# Patient Record
Sex: Female | Born: 1970 | Race: White | Hispanic: No | Marital: Married | Smoking: Never smoker
Health system: Southern US, Community
[De-identification: ages and names within clinical notes are randomized; demographics above are authoritative.]

## PROBLEM LIST (undated history)

## (undated) DIAGNOSIS — L0390 Cellulitis, unspecified: Secondary | ICD-10-CM

## (undated) DIAGNOSIS — Z87442 Personal history of urinary calculi: Secondary | ICD-10-CM

## (undated) DIAGNOSIS — M1990 Unspecified osteoarthritis, unspecified site: Secondary | ICD-10-CM

## (undated) HISTORY — PX: BUNIONECTOMY: SHX129

## (undated) HISTORY — PX: WISDOM TOOTH EXTRACTION: SHX21

---

## 2005-06-07 DIAGNOSIS — L0390 Cellulitis, unspecified: Secondary | ICD-10-CM

## 2005-06-07 HISTORY — DX: Cellulitis, unspecified: L03.90

## 2016-08-02 ENCOUNTER — Encounter (HOSPITAL_COMMUNITY): Payer: Self-pay | Admitting: Emergency Medicine

## 2016-08-02 ENCOUNTER — Ambulatory Visit (HOSPITAL_COMMUNITY)
Admission: EM | Admit: 2016-08-02 | Discharge: 2016-08-02 | Disposition: A | Discharge status: Home / Self Care | Payer: BC Managed Care – PPO | Attending: Family Medicine | Admitting: Family Medicine

## 2016-08-02 ENCOUNTER — Ambulatory Visit (INDEPENDENT_AMBULATORY_CARE_PROVIDER_SITE_OTHER): Payer: BC Managed Care – PPO

## 2016-08-02 DIAGNOSIS — M25561 Pain in right knee: Secondary | ICD-10-CM | POA: Diagnosis not present

## 2016-08-02 DIAGNOSIS — M25461 Effusion, right knee: Secondary | ICD-10-CM | POA: Insufficient documentation

## 2016-08-02 HISTORY — DX: Cellulitis, unspecified: L03.90

## 2016-08-02 LAB — URIC ACID: Uric Acid, Serum: 3.4 mg/dL (ref 2.3–6.6)

## 2016-08-02 MED ORDER — ACETAMINOPHEN 325 MG PO TABS
ORAL_TABLET | ORAL | Status: AC
  Filled 2016-08-02: qty 2

## 2016-08-02 MED ORDER — ACETAMINOPHEN 325 MG PO TABS
650.0000 mg | ORAL_TABLET | Freq: Once | ORAL | Status: AC
  Administered 2016-08-02: 650 mg via ORAL

## 2016-08-02 NOTE — ED Provider Notes (Addendum)
CSN: [id number]     Arrival date & time 08/02/16  1808 History   First MD Initiated Contact with Patient 08/02/16 1933     Chief Complaint  Patient presents with  . Knee Pain   (Consider location/radiation/quality/duration/timing/severity/associated sxs/prior Treatment) 46 year old female presents the urgent care with knee pain and swelling. She states that last Wednesday, 5 days ago she had been running. 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 The following day she did squats and lunges. On Friday the third day, she started having stiffness in the knee and then on Saturday some swelling and more stiffness and discomfort. Over Sunday and today she did develop some pain with increased swelling of the right knee.      Past Medical History:  Diagnosis Date  . Cellulitis 2007   Past Surgical History:  Procedure Laterality Date  . CESAREAN SECTION     History reviewed. No pertinent family history. Social History  Substance Use Topics  . Smoking status: Never Smoker  . Smokeless tobacco: Never Used  . Alcohol use No   OB History    Gravida Para Term Preterm AB Living   4         4   SAB TAB Ectopic Multiple Live Births                 Review of Systems  Constitutional: Negative for activity change, chills and fever.  HENT: Negative.   Respiratory: Negative.   Cardiovascular: Negative.   Musculoskeletal: Positive for arthralgias.       As per HPI  Skin: Negative for color change, pallor and rash.  Neurological: Negative.     Allergies  Patient has no known allergies.  Home Medications   Prior to Admission medications   Medication Sig Start Date End Date Taking? Authorizing Provider  ibuprofen (ADVIL,MOTRIN) 600 MG tablet Take 600 mg by mouth every 6 (six) hours as needed.   Yes Historical Provider, MD   Meds Ordered and Administered this Visit   Medications  acetaminophen (TYLENOL) tablet 650 mg (650 mg Oral Given 08/02/16  1937)    BP 132/94 (BP Location: Right Arm)   Pulse 80   Temp 101.2 F (38.4 C) (Oral)   LMP 07/31/2016 (Exact Date)   SpO2 100%  No data found.   Physical Exam  Constitutional: She is oriented to person, place, and time. She appears well-developed and well-nourished. No distress.  HENT:  Head: Normocephalic and atraumatic.  Eyes: EOM are normal.  Neck: Normal range of motion. Neck supple.  Pulmonary/Chest: Effort normal.  Musculoskeletal:  Right knee with swelling and apparent effusion. No erythema, increased warmth. Minor tenderness. Extension to 170. Flexion limited to 90. Negative drawer, negative varus, negative valgus, external and internal rotation does not cause pain. Fluid is palpated at the joint spaces and proximal knee.  Neurological: She is alert and oriented to person, place, and time. No cranial nerve deficit.  Skin: Skin is warm and dry. Capillary refill takes less than 2 seconds.  Psychiatric: She has a normal mood and affect.  Nursing note and vitals reviewed.   Urgent Care Course     Procedures (including critical care time)  Labs Review Labs Reviewed  URIC ACID    Imaging Review Dg Knee Complete 4 Views Right  Result Date: 08/02/2016 CLINICAL DATA:  Right knee swelling. EXAM: RIGHT KNEE - COMPLETE 4+ VIEW COMPARISON:  None. FINDINGS: No acute fracture or dislocation.  No lytic or sclerotic osseous lesion. No bone destruction or periosteal reaction. Large right knee joint effusion. Mild medial femorotibial compartment joint space narrowing. IMPRESSION: 1.  No acute osseous injury of the right knee. 2. Large right knee joint effusion. Electronically Signed   By: Elige Ko   On: 08/02/2016 19:40     Visual Acuity Review  Right Eye Distance:   Left Eye Distance:   Bilateral Distance:    Right Eye Near:   Left Eye Near:    Bilateral Near:         MDM   1. Acute pain of right knee   2. Effusion of right knee    Wear the knee immobilizer  at most times. Apply cold compresses for the next to 3 days over the knee. Use crutches with limited weightbearing. Follow-up with the orthopedist listed on page one, it is a group rather than a specific orthopedist. May take ibuprofen as needed. Pt has crutches The patient is noted to have a temperature 101.2. She states she does not feel ill at all. All questions related to symptoms of infection or illness are negative. Consulted with Dr.Xu. To follow-up later this week.    Hayden Rasmussen, NP 08/02/16 1610    Hayden Rasmussen, NP 08/02/16 2125

## 2016-08-02 NOTE — Discharge Instructions (Signed)
Wear the knee immobilizer at most times. Apply cold compresses for the next to 3 days over the knee. Use crutches with limited weightbearing. Follow-up with the orthopedist listed on page one, it is a group rather than a specific orthopedist. May take ibuprofen as needed.

## 2016-08-02 NOTE — ED Triage Notes (Addendum)
Pt ran on Wednesday and did squats and lunges on Thursday.  She states that the knee began to hurt on Friday.  Pt reports swelling in the right knee and increasing pain.

## 2016-08-03 ENCOUNTER — Ambulatory Visit (INDEPENDENT_AMBULATORY_CARE_PROVIDER_SITE_OTHER): Payer: BC Managed Care – PPO | Admitting: Orthopaedic Surgery

## 2016-08-03 ENCOUNTER — Encounter (INDEPENDENT_AMBULATORY_CARE_PROVIDER_SITE_OTHER): Payer: Self-pay | Admitting: Orthopaedic Surgery

## 2016-08-03 VITALS — BP 124/88 | HR 88 | Resp 14 | Ht 65.0 in | Wt 140.0 lb

## 2016-08-03 DIAGNOSIS — M25561 Pain in right knee: Secondary | ICD-10-CM | POA: Diagnosis not present

## 2016-08-03 LAB — SYNOVIAL CELL COUNT + DIFF, W/ CRYSTALS
Basophils, %: 0 %
EOSINOPHILS-SYNOVIAL: 0 % (ref 0–2)
Lymphocytes-Synovial Fld: 27 % (ref 0–74)
Monocyte/Macrophage: 0 % (ref 0–69)
NEUTROPHIL, SYNOVIAL: 73 % — AB (ref 0–24)
SYNOVIOCYTES, %: 0 % (ref 0–15)
WBC, SYNOVIAL: 61360 {cells}/uL — AB (ref <150)

## 2016-08-03 MED ORDER — LIDOCAINE HCL 1 % IJ SOLN
5.0000 mL | INTRAMUSCULAR | Status: AC | PRN
  Administered 2016-08-03: 5 mL

## 2016-08-03 MED ORDER — BUPIVACAINE HCL 0.5 % IJ SOLN
3.0000 mL | INTRAMUSCULAR | Status: AC | PRN
  Administered 2016-08-03: 3 mL via INTRA_ARTICULAR

## 2016-08-03 MED ORDER — METHYLPREDNISOLONE ACETATE 40 MG/ML IJ SUSP
80.0000 mg | INTRAMUSCULAR | Status: AC | PRN
  Administered 2016-08-03: 80 mg

## 2016-08-03 NOTE — Progress Notes (Signed)
Office Visit Note   Patient: Diamond Cox           Date of Birth: 1971-03-10           MRN: [id number] Visit Date: 08/03/2016              Requested by: No referring provider defined for this encounter. PCP: Pcp Not In System   Assessment & Plan: Visit Diagnoses: acute right knee effusion, pain  Plan:aspiration right knee, cortisone injection, office Friday Aspirated over 40 mL of cloudy, green fluid. I suspect she may have pseudogout. I injected  cortisone and we'll plan to see her back on Friday. Follow-Up Instructions: No Follow-up on file.   Orders:  No orders of the defined types were placed in this encounter.  No orders of the defined types were placed in this encounter.     Procedures: Large Joint Inj Date/Time: 08/03/2016 1:03 PM Performed by: Valeria BatmanWHITFIELD, Jaquis Picklesimer W Authorized by: Valeria BatmanWHITFIELD, Ridhi Hoffert W   Consent Given by:  Patient Timeout: prior to procedure the correct patient, procedure, and site was verified   Indications:  Pain and joint swelling Location:  Knee Site:  R knee Prep: patient was prepped and draped in usual sterile fashion   Needle Size:  25 G Needle Length:  1.5 inches Approach:  Anteromedial Ultrasound Guidance: No   Fluoroscopic Guidance: No   Arthrogram: No   Medications:  5 mL lidocaine 1 %; 80 mg methylPREDNISolone acetate 40 MG/ML; 3 mL bupivacaine 0.5 % Aspiration Attempted: No   Aspirate amount (mL):  45 Aspirate:  Cloudy Patient tolerance:  Patient tolerated the procedure well with no immediate complications     Clinical Data: No additional findings.   Subjective: No chief complaint on file.   46 year old female presents with knee pain and swelling. She states that last Wednesday, 5 days ago she had been running. 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 The following day she did squats and lunges. On Friday the third day, she started having stiffness in the knee and then on  Saturday some swelling and more stiffness and discomfort. Over Sunday and today she did develop some pain with increased swelling of the right knee.   [facility]. Workup was negative. Has had an elevated temperature high as 101. Right knee feels tight but not painful. Has been using crutches and a knee immobilizer. A specific injury or trauma or prior problem with this same knee..  Review of Systems   Objective: Vital Signs: LMP 07/31/2016 (Exact Date)   Physical Exam  Ortho ExamRight knee with large effusion. Minimally warm. No localized area of tenderness. No instability. Cultures to a straight leg raise related to the size of the effusion. No calf pain. No distal edema. Intact.  Specialty Comments:  No specialty comments available.  Imaging: Dg Knee Complete 4 Views Right  Result Date: 08/02/2016 CLINICAL DATA:  Right knee swelling. EXAM: RIGHT KNEE - COMPLETE 4+ VIEW COMPARISON:  None. FINDINGS: No acute fracture or dislocation. No lytic or sclerotic osseous lesion. No bone destruction or periosteal reaction. Large right knee joint effusion. Mild medial femorotibial compartment joint space narrowing. IMPRESSION: 1.  No acute osseous injury of the right knee. 2. Large right knee joint effusion. Electronically Signed   By: Elige KoHetal  Patel   On: 08/02/2016 19:40     PMFS History: There are no active problems to display for this patient.  Past Medical History:  Diagnosis Date  . Cellulitis 2007    No family history on file.  Past Surgical History:  Procedure Laterality Date  . CESAREAN SECTION     Social History   Occupational History  . Not on file.   Social History Main Topics  . Smoking status: Never Smoker  . Smokeless tobacco: Never Used  . Alcohol use No  . Drug use: No  . Sexual activity: Not on file

## 2016-08-05 ENCOUNTER — Encounter (HOSPITAL_COMMUNITY): Payer: Self-pay | Admitting: *Deleted

## 2016-08-05 NOTE — H&P (Signed)
Diamond CampbellPeter Whitfield, MD   Diamond CodeBrian Tashaun Obey, PA-C [address], [place], [place]  [id number]                             [phone]   ORTHOPAEDIC HISTORY & PHYSICAL  Diamond IceJamie Cox MRN:  [id number] DOB/SEX:  Apr 15, 1971/female  CHIEF COMPLAINT:  Painful right knee  HISTORY:  46 year old female presents with knee pain and swelling. She states that on  she had been running. 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 She was running on 07/28/2016 without difficulty. The following day she did squats and lunges. On Friday the third day, she started having stiffness in the knee and then on Saturday some swelling and more stiffness and discomfort. Over that Sunday she did develop some pain with increased swelling of the right knee.  [facility]. Workup was negative. Has had an elevated temperature high as 101. Right knee felt tight but not painful. Had been using crutches and a knee immobilizer. No specific injury or trauma or prior problem with this same knee..  An aspiration right knee and a cortisone injection in office 08/03/2016. Aspirated over 40 mL of cloudy, green fluid. Suspect she may have pseudogout and injection of cortisone given.  Cultures were sent.  Laboratory called and a verbal report was given showing staph aureus growing but speciated yet.    Patient was called and plans are now in place for an arthroscopic debridement and lavage of the right knee.  PAST MEDICAL HISTORY: There are no active problems to display for this patient.  Past Medical History:  Diagnosis Date  . Cellulitis 2007   Past Surgical History:  Procedure Laterality Date  . CESAREAN SECTION       MEDICATIONS:   No current facility-administered medications for this encounter.    Current Outpatient Prescriptions  Medication Sig Dispense Refill  . acetaminophen (TYLENOL) 500 MG tablet Take 500 mg by mouth every 6 (six) hours as  needed.    Marland Kitchen. ibuprofen (ADVIL,MOTRIN) 600 MG tablet Take 600 mg by mouth every 6 (six) hours as needed.         ALLERGIES:  No Known Allergies  REVIEW OF SYSTEMS:  A comprehensive review of systems was negative.   FAMILY HISTORY:  No family history on file.  SOCIAL HISTORY:   Social History  Substance Use Topics  . Smoking status: Never Smoker  . Smokeless tobacco: Never Used  . Alcohol use No      EXAMINATION: Vital signs in last 24 hours:    Head is normocephalic.   Eyes:  Pupils equal, round and reactive to light and accommodation.  Extraocular intact. ENT: Ears, nose, and throat were benign.   Neck: supple, no bruits were noted.   Chest: good expansion.   Lungs: essentially clear.   Cardiac: regular rhythm and rate, normal S1, S2.  No murmurs appreciated. Pulses :  2+ bilateral and symmetric in lower extremities. Abdomen is scaphoid, soft, nontender, no masses palpable, normal bowel sounds present. CNS:  She is oriented x3 and cranial nerves II-XII grossly intact. Breast, rectal, and genital exams: not performed and not indicated for an orthopedic evaluation. Musculoskeletal: Right knee with large effusion. Minimally warm. No localized area of tenderness. No instability.   Imaging Review Result Date: 08/02/2016 CLINICAL DATA:  Right knee swelling. EXAM: RIGHT KNEE - COMPLETE 4+ VIEW  COMPARISON:  None. FINDINGS: No acute fracture or dislocation. No lytic or sclerotic osseous lesion. No bone destruction or periosteal reaction. Large right knee joint effusion. Mild medial femorotibial compartment joint space narrowing. IMPRESSION: 1.  No acute osseous injury of the right knee. 2. Large right knee joint effusion. Electronically Signed   By: Elige Ko   On: 08/02/2016 19:40   ASSESSMENT: right septic arthritis of the knee  Past Medical History:  Diagnosis Date  . Cellulitis 2007    PLAN: Plan for right arthroscopic lavage of the knee with debridement  The  procedure,  risks, and benefits of surgery were presented and reviewed. The risks including but not limited to infection, blood clots, vascular and nerve injury, stiffness,  among others were discussed. The patient acknowledged the explanation, agreed to proceed.   Oris Drone Aleda Grana [facility] [phone]  08/05/2016 5:04 PM

## 2016-08-05 NOTE — Progress Notes (Signed)
Spoke with pt for pre-op call. Pt denies cardiac history, chest pain or sob. 

## 2016-08-06 ENCOUNTER — Ambulatory Visit (INDEPENDENT_AMBULATORY_CARE_PROVIDER_SITE_OTHER): Payer: BC Managed Care – PPO | Admitting: Orthopaedic Surgery

## 2016-08-06 ENCOUNTER — Inpatient Hospital Stay (HOSPITAL_COMMUNITY)
Admission: RE | Admit: 2016-08-06 | Discharge: 2016-08-08 | DRG: 487 | Disposition: A | Discharge status: Home Health | Payer: BC Managed Care – PPO | Source: Ambulatory Visit | Attending: Orthopaedic Surgery | Admitting: Orthopaedic Surgery

## 2016-08-06 ENCOUNTER — Inpatient Hospital Stay (HOSPITAL_COMMUNITY): Payer: BC Managed Care – PPO | Admitting: Certified Registered Nurse Anesthetist

## 2016-08-06 ENCOUNTER — Encounter (HOSPITAL_COMMUNITY)
Admission: RE | Disposition: A | Discharge status: Home Health | Payer: Self-pay | Source: Ambulatory Visit | Attending: Orthopaedic Surgery

## 2016-08-06 ENCOUNTER — Encounter (HOSPITAL_COMMUNITY): Payer: Self-pay | Admitting: *Deleted

## 2016-08-06 DIAGNOSIS — M00061 Staphylococcal arthritis, right knee: Secondary | ICD-10-CM | POA: Diagnosis present

## 2016-08-06 DIAGNOSIS — M00861 Arthritis due to other bacteria, right knee: Secondary | ICD-10-CM | POA: Diagnosis not present

## 2016-08-06 DIAGNOSIS — Z820 Family history of epilepsy and other diseases of the nervous system: Secondary | ICD-10-CM

## 2016-08-06 DIAGNOSIS — Z87442 Personal history of urinary calculi: Secondary | ICD-10-CM | POA: Diagnosis not present

## 2016-08-06 DIAGNOSIS — Z9889 Other specified postprocedural states: Secondary | ICD-10-CM | POA: Diagnosis not present

## 2016-08-06 DIAGNOSIS — B9561 Methicillin susceptible Staphylococcus aureus infection as the cause of diseases classified elsewhere: Secondary | ICD-10-CM | POA: Diagnosis present

## 2016-08-06 DIAGNOSIS — Z91018 Allergy to other foods: Secondary | ICD-10-CM | POA: Diagnosis not present

## 2016-08-06 DIAGNOSIS — M009 Pyogenic arthritis, unspecified: Secondary | ICD-10-CM | POA: Diagnosis present

## 2016-08-06 DIAGNOSIS — M25561 Pain in right knee: Secondary | ICD-10-CM | POA: Diagnosis present

## 2016-08-06 DIAGNOSIS — Z9689 Presence of other specified functional implants: Secondary | ICD-10-CM | POA: Diagnosis not present

## 2016-08-06 HISTORY — DX: Personal history of urinary calculi: Z87.442

## 2016-08-06 HISTORY — PX: KNEE ARTHROSCOPY: SHX127

## 2016-08-06 HISTORY — DX: Unspecified osteoarthritis, unspecified site: M19.90

## 2016-08-06 LAB — URINALYSIS, ROUTINE W REFLEX MICROSCOPIC
BACTERIA UA: NONE SEEN
Bilirubin Urine: NEGATIVE
Glucose, UA: NEGATIVE mg/dL
Ketones, ur: 5 mg/dL — AB
LEUKOCYTES UA: NEGATIVE
Nitrite: NEGATIVE
PH: 5 (ref 5.0–8.0)
Protein, ur: NEGATIVE mg/dL
SPECIFIC GRAVITY, URINE: 1.024 (ref 1.005–1.030)
SQUAMOUS EPITHELIAL / LPF: NONE SEEN

## 2016-08-06 LAB — COMPREHENSIVE METABOLIC PANEL
ALK PHOS: 49 U/L (ref 38–126)
ALT: 11 U/L — ABNORMAL LOW (ref 14–54)
ANION GAP: 9 (ref 5–15)
AST: 12 U/L — ABNORMAL LOW (ref 15–41)
Albumin: 3.6 g/dL (ref 3.5–5.0)
BUN: 10 mg/dL (ref 6–20)
CALCIUM: 9.4 mg/dL (ref 8.9–10.3)
CO2: 26 mmol/L (ref 22–32)
Chloride: 105 mmol/L (ref 101–111)
Creatinine, Ser: 0.68 mg/dL (ref 0.44–1.00)
Glucose, Bld: 97 mg/dL (ref 65–99)
Potassium: 3.6 mmol/L (ref 3.5–5.1)
Sodium: 140 mmol/L (ref 135–145)
Total Bilirubin: 0.7 mg/dL (ref 0.3–1.2)
Total Protein: 6.8 g/dL (ref 6.5–8.1)

## 2016-08-06 LAB — CBC WITH DIFFERENTIAL/PLATELET
Basophils Absolute: 0 10*3/uL (ref 0.0–0.1)
Basophils Relative: 0 %
EOS ABS: 0 10*3/uL (ref 0.0–0.7)
EOS PCT: 0 %
HCT: 36.7 % (ref 36.0–46.0)
Hemoglobin: 12.5 g/dL (ref 12.0–15.0)
LYMPHS ABS: 1.2 10*3/uL (ref 0.7–4.0)
Lymphocytes Relative: 14 %
MCH: 28.8 pg (ref 26.0–34.0)
MCHC: 34.1 g/dL (ref 30.0–36.0)
MCV: 84.6 fL (ref 78.0–100.0)
MONOS PCT: 4 %
Monocytes Absolute: 0.4 10*3/uL (ref 0.1–1.0)
Neutro Abs: 7.2 10*3/uL (ref 1.7–7.7)
Neutrophils Relative %: 82 %
PLATELETS: 259 10*3/uL (ref 150–400)
RBC: 4.34 MIL/uL (ref 3.87–5.11)
RDW: 12.9 % (ref 11.5–15.5)
WBC: 8.8 10*3/uL (ref 4.0–10.5)

## 2016-08-06 LAB — SYNOVIAL CELL COUNT + DIFF, W/ CRYSTALS
Crystals, Fluid: NONE SEEN
EOSINOPHILS-SYNOVIAL: 0 % (ref 0–1)
Lymphocytes-Synovial Fld: 9 % (ref 0–20)
MONOCYTE-MACROPHAGE-SYNOVIAL FLUID: 7 % — AB (ref 50–90)
Neutrophil, Synovial: 84 % — ABNORMAL HIGH (ref 0–25)
OTHER CELLS-SYN: 0
WBC, Synovial: 60625 /mm3 — ABNORMAL HIGH (ref 0–200)

## 2016-08-06 LAB — BODY FLUID CULTURE: GRAM STAIN: NONE SEEN

## 2016-08-06 LAB — PROTIME-INR
INR: 1.04
PROTHROMBIN TIME: 13.7 s (ref 11.4–15.2)

## 2016-08-06 LAB — HCG, SERUM, QUALITATIVE: PREG SERUM: NEGATIVE

## 2016-08-06 LAB — GLUCOSE, CAPILLARY: Glucose-Capillary: 94 mg/dL (ref 65–99)

## 2016-08-06 LAB — APTT: APTT: 32 s (ref 24–36)

## 2016-08-06 SURGERY — ARTHROSCOPY, KNEE
Anesthesia: General | Site: Knee | Laterality: Right

## 2016-08-06 MED ORDER — CEFAZOLIN SODIUM-DEXTROSE 2-4 GM/100ML-% IV SOLN
2.0000 g | Freq: Once | INTRAVENOUS | Status: AC
  Administered 2016-08-06: 2 g via INTRAVENOUS

## 2016-08-06 MED ORDER — SODIUM CHLORIDE 0.9 % IR SOLN
Status: DC | PRN
  Administered 2016-08-06: 1000 mL
  Administered 2016-08-06: 3000 mL
  Administered 2016-08-06: 1000 mL
  Administered 2016-08-06: 3000 mL

## 2016-08-06 MED ORDER — LIDOCAINE-EPINEPHRINE (PF) 1 %-1:200000 IJ SOLN
INTRAMUSCULAR | Status: DC | PRN
Start: 2016-08-06 — End: 2016-08-06
  Administered 2016-08-06: 17 mL via INTRADERMAL

## 2016-08-06 MED ORDER — ONDANSETRON HCL 4 MG/2ML IJ SOLN
INTRAMUSCULAR | Status: AC
  Filled 2016-08-06: qty 2

## 2016-08-06 MED ORDER — FENTANYL CITRATE (PF) 100 MCG/2ML IJ SOLN
INTRAMUSCULAR | Status: AC
  Filled 2016-08-06: qty 2

## 2016-08-06 MED ORDER — PROPOFOL 10 MG/ML IV BOLUS
INTRAVENOUS | Status: AC
  Filled 2016-08-06: qty 20

## 2016-08-06 MED ORDER — METOCLOPRAMIDE HCL 5 MG PO TABS: 5.0000 mg | ORAL_TABLET | Freq: Three times a day (TID) | ORAL | Status: DC | PRN

## 2016-08-06 MED ORDER — KETOROLAC TROMETHAMINE 15 MG/ML IJ SOLN
15.0000 mg | Freq: Four times a day (QID) | INTRAMUSCULAR | Status: AC
  Administered 2016-08-06 – 2016-08-07 (×4): 15 mg via INTRAVENOUS
  Filled 2016-08-06 (×4): qty 1

## 2016-08-06 MED ORDER — MIDAZOLAM HCL 2 MG/2ML IJ SOLN
INTRAMUSCULAR | Status: DC
Start: 2016-08-06 — End: 2016-08-06
  Filled 2016-08-06: qty 2

## 2016-08-06 MED ORDER — SUGAMMADEX SODIUM 200 MG/2ML IV SOLN
INTRAVENOUS | Status: AC
Start: 2016-08-06 — End: 2016-08-06
  Filled 2016-08-06: qty 2

## 2016-08-06 MED ORDER — ONDANSETRON HCL 4 MG PO TABS: 4.0000 mg | ORAL_TABLET | Freq: Four times a day (QID) | ORAL | Status: DC | PRN

## 2016-08-06 MED ORDER — LIDOCAINE-EPINEPHRINE (PF) 1 %-1:200000 IJ SOLN
INTRAMUSCULAR | Status: AC
  Filled 2016-08-06: qty 30

## 2016-08-06 MED ORDER — ONDANSETRON HCL 4 MG/2ML IJ SOLN
4.0000 mg | Freq: Four times a day (QID) | INTRAMUSCULAR | Status: DC | PRN
  Administered 2016-08-06 – 2016-08-08 (×2): 4 mg via INTRAVENOUS
  Filled 2016-08-06: qty 2

## 2016-08-06 MED ORDER — HYDROMORPHONE HCL 1 MG/ML IJ SOLN
INTRAMUSCULAR | Status: AC
  Administered 2016-08-06: 0.5 mg via INTRAVENOUS
  Filled 2016-08-06: qty 0.5

## 2016-08-06 MED ORDER — ONDANSETRON HCL 4 MG/2ML IJ SOLN
INTRAMUSCULAR | Status: DC | PRN
  Administered 2016-08-06: 4 mg via INTRAVENOUS

## 2016-08-06 MED ORDER — MAGNESIUM HYDROXIDE 400 MG/5ML PO SUSP: 30.0000 mL | Freq: Every day | ORAL | Status: DC | PRN

## 2016-08-06 MED ORDER — FENTANYL CITRATE (PF) 100 MCG/2ML IJ SOLN
INTRAMUSCULAR | Status: AC
  Filled 2016-08-06: qty 4

## 2016-08-06 MED ORDER — BUPIVACAINE HCL (PF) 0.25 % IJ SOLN
INTRAMUSCULAR | Status: AC
  Filled 2016-08-06: qty 30

## 2016-08-06 MED ORDER — ZOLPIDEM TARTRATE 5 MG PO TABS: 5.0000 mg | ORAL_TABLET | Freq: Every evening | ORAL | Status: DC | PRN

## 2016-08-06 MED ORDER — DEXMEDETOMIDINE HCL IN NACL 200 MCG/50ML IV SOLN
INTRAVENOUS | Status: AC
  Filled 2016-08-06: qty 50

## 2016-08-06 MED ORDER — PHENYLEPHRINE 40 MCG/ML (10ML) SYRINGE FOR IV PUSH (FOR BLOOD PRESSURE SUPPORT)
PREFILLED_SYRINGE | INTRAVENOUS | Status: AC
  Filled 2016-08-06: qty 20

## 2016-08-06 MED ORDER — PROMETHAZINE HCL 25 MG/ML IJ SOLN
INTRAMUSCULAR | Status: AC
  Administered 2016-08-06: 6.25 mg via INTRAVENOUS
  Filled 2016-08-06: qty 1

## 2016-08-06 MED ORDER — ACETAMINOPHEN 10 MG/ML IV SOLN
1000.0000 mg | Freq: Once | INTRAVENOUS | Status: AC
  Administered 2016-08-06: 1000 mg via INTRAVENOUS
  Filled 2016-08-06: qty 100

## 2016-08-06 MED ORDER — FENTANYL CITRATE (PF) 100 MCG/2ML IJ SOLN
INTRAMUSCULAR | Status: DC | PRN
  Administered 2016-08-06 (×4): 50 ug via INTRAVENOUS

## 2016-08-06 MED ORDER — CEFAZOLIN SODIUM-DEXTROSE 2-4 GM/100ML-% IV SOLN
INTRAVENOUS | Status: AC
  Administered 2016-08-06: 2 g via INTRAVENOUS
  Filled 2016-08-06: qty 100

## 2016-08-06 MED ORDER — CHLORHEXIDINE GLUCONATE CLOTH 2 % EX PADS: 6.0000 | MEDICATED_PAD | Freq: Once | CUTANEOUS | Status: DC

## 2016-08-06 MED ORDER — PROPOFOL 10 MG/ML IV BOLUS
INTRAVENOUS | Status: DC | PRN
  Administered 2016-08-06: 130 mg via INTRAVENOUS

## 2016-08-06 MED ORDER — HYDROMORPHONE HCL 1 MG/ML IJ SOLN
INTRAMUSCULAR | Status: AC
  Administered 2016-08-06: 0.5 mg via INTRAVENOUS
  Filled 2016-08-06: qty 1

## 2016-08-06 MED ORDER — CEFAZOLIN IN D5W 1 GM/50ML IV SOLN: 1.0000 g | Freq: Four times a day (QID) | INTRAVENOUS | Status: DC

## 2016-08-06 MED ORDER — ACETAMINOPHEN 500 MG PO TABS
1000.0000 mg | ORAL_TABLET | Freq: Four times a day (QID) | ORAL | Status: DC
  Administered 2016-08-06 – 2016-08-08 (×6): 1000 mg via ORAL
  Filled 2016-08-06 (×6): qty 2

## 2016-08-06 MED ORDER — PROMETHAZINE HCL 25 MG/ML IJ SOLN
6.2500 mg | Freq: Once | INTRAMUSCULAR | Status: AC
  Administered 2016-08-06: 6.25 mg via INTRAVENOUS

## 2016-08-06 MED ORDER — BUPIVACAINE HCL (PF) 0.25 % IJ SOLN
INTRAMUSCULAR | Status: DC | PRN
  Administered 2016-08-06: .09 mL via INTRA_ARTICULAR

## 2016-08-06 MED ORDER — METOCLOPRAMIDE HCL 5 MG/ML IJ SOLN: 5.0000 mg | Freq: Three times a day (TID) | INTRAMUSCULAR | Status: DC | PRN

## 2016-08-06 MED ORDER — SUCCINYLCHOLINE CHLORIDE 200 MG/10ML IV SOSY
PREFILLED_SYRINGE | INTRAVENOUS | Status: AC
  Filled 2016-08-06: qty 20

## 2016-08-06 MED ORDER — LIDOCAINE 2% (20 MG/ML) 5 ML SYRINGE
INTRAMUSCULAR | Status: AC
  Filled 2016-08-06: qty 25

## 2016-08-06 MED ORDER — SODIUM CHLORIDE 0.9 % IV SOLN: INTRAVENOUS | Status: DC

## 2016-08-06 MED ORDER — DEXAMETHASONE SODIUM PHOSPHATE 10 MG/ML IJ SOLN
INTRAMUSCULAR | Status: AC
  Filled 2016-08-06: qty 4

## 2016-08-06 MED ORDER — ONDANSETRON HCL 4 MG/2ML IJ SOLN
INTRAMUSCULAR | Status: AC
  Filled 2016-08-06: qty 4

## 2016-08-06 MED ORDER — EPHEDRINE 5 MG/ML INJ
INTRAVENOUS | Status: AC
  Filled 2016-08-06: qty 10

## 2016-08-06 MED ORDER — OXYCODONE HCL 5 MG PO TABS
5.0000 mg | ORAL_TABLET | ORAL | Status: DC | PRN
  Administered 2016-08-06: 10 mg via ORAL
  Administered 2016-08-06: 5 mg via ORAL
  Administered 2016-08-07 – 2016-08-08 (×5): 10 mg via ORAL
  Filled 2016-08-06 (×3): qty 2
  Filled 2016-08-06: qty 1
  Filled 2016-08-06 (×2): qty 2

## 2016-08-06 MED ORDER — CEFAZOLIN SODIUM-DEXTROSE 2-4 GM/100ML-% IV SOLN
2.0000 g | Freq: Three times a day (TID) | INTRAVENOUS | Status: DC
  Administered 2016-08-06 – 2016-08-08 (×6): 2 g via INTRAVENOUS
  Filled 2016-08-06 (×6): qty 100

## 2016-08-06 MED ORDER — DIPHENHYDRAMINE HCL 12.5 MG/5ML PO ELIX: 12.5000 mg | ORAL_SOLUTION | ORAL | Status: DC | PRN

## 2016-08-06 MED ORDER — MIDAZOLAM HCL 5 MG/5ML IJ SOLN
INTRAMUSCULAR | Status: DC | PRN
  Administered 2016-08-06: 2 mg via INTRAVENOUS

## 2016-08-06 MED ORDER — CHLORHEXIDINE GLUCONATE 4 % EX LIQD: 60.0000 mL | Freq: Once | CUTANEOUS | Status: DC

## 2016-08-06 MED ORDER — MAGNESIUM CITRATE PO SOLN: 1.0000 | Freq: Once | ORAL | Status: DC | PRN

## 2016-08-06 MED ORDER — HYDROMORPHONE HCL 2 MG/ML IJ SOLN
0.5000 mg | INTRAMUSCULAR | Status: DC | PRN
  Administered 2016-08-07: 1 mg via INTRAVENOUS
  Filled 2016-08-06: qty 1

## 2016-08-06 MED ORDER — BISACODYL 10 MG RE SUPP: 10.0000 mg | Freq: Every day | RECTAL | Status: DC | PRN

## 2016-08-06 MED ORDER — ROCURONIUM BROMIDE 50 MG/5ML IV SOSY
PREFILLED_SYRINGE | INTRAVENOUS | Status: AC
  Filled 2016-08-06: qty 25

## 2016-08-06 MED ORDER — OXYCODONE HCL 5 MG PO TABS
ORAL_TABLET | ORAL | Status: AC
  Administered 2016-08-06: 10 mg via ORAL
  Filled 2016-08-06: qty 2

## 2016-08-06 MED ORDER — HYDROMORPHONE HCL 1 MG/ML IJ SOLN
0.2500 mg | INTRAMUSCULAR | Status: DC | PRN
  Administered 2016-08-06 (×3): 0.5 mg via INTRAVENOUS

## 2016-08-06 MED ORDER — LIDOCAINE 2% (20 MG/ML) 5 ML SYRINGE
INTRAMUSCULAR | Status: DC | PRN
  Administered 2016-08-06: 60 mg via INTRAVENOUS

## 2016-08-06 MED ORDER — SODIUM CHLORIDE 0.9 % IR SOLN
Status: DC | PRN
  Administered 2016-08-06: 3000 mL

## 2016-08-06 MED ORDER — MIDAZOLAM HCL 2 MG/2ML IJ SOLN
INTRAMUSCULAR | Status: AC
  Filled 2016-08-06: qty 2

## 2016-08-06 MED ORDER — LACTATED RINGERS IV SOLN
INTRAVENOUS | Status: DC
  Administered 2016-08-06: 11:00:00 via INTRAVENOUS

## 2016-08-06 SURGICAL SUPPLY — 37 items
BANDAGE ACE 6X5 VEL STRL LF (GAUZE/BANDAGES/DRESSINGS) ×3 IMPLANT
BANDAGE ELASTIC 6 VELCRO ST LF (GAUZE/BANDAGES/DRESSINGS) ×3 IMPLANT
BLADE CUDA 5.5 (BLADE) IMPLANT
BLADE GREAT WHITE 4.2 (BLADE) ×2 IMPLANT
BLADE GREAT WHITE 4.2MM (BLADE) ×1
BUR OVAL 6.0 (BURR) IMPLANT
CUFF TOURNIQUET SINGLE 34IN LL (TOURNIQUET CUFF) IMPLANT
CUFF TOURNIQUET SINGLE 44IN (TOURNIQUET CUFF) IMPLANT
DECANTER SPIKE VIAL GLASS SM (MISCELLANEOUS) ×3 IMPLANT
DRAPE ARTHROSCOPY W/POUCH 114 (DRAPES) ×3 IMPLANT
DRSG EMULSION OIL 3X3 NADH (GAUZE/BANDAGES/DRESSINGS) ×3 IMPLANT
DRSG PAD ABDOMINAL 8X10 ST (GAUZE/BANDAGES/DRESSINGS) ×3 IMPLANT
DURAPREP 26ML APPLICATOR (WOUND CARE) ×3 IMPLANT
GAUZE SPONGE 4X4 12PLY STRL (GAUZE/BANDAGES/DRESSINGS) ×3 IMPLANT
GLOVE BIOGEL PI IND STRL 8 (GLOVE) ×1 IMPLANT
GLOVE BIOGEL PI IND STRL 8.5 (GLOVE) ×1 IMPLANT
GLOVE BIOGEL PI INDICATOR 8 (GLOVE) ×2
GLOVE BIOGEL PI INDICATOR 8.5 (GLOVE) ×2
GLOVE ECLIPSE 8.0 STRL XLNG CF (GLOVE) ×3 IMPLANT
GLOVE SURG ORTHO 8.5 STRL (GLOVE) ×3 IMPLANT
GLOVE SURG SS PI 7.5 STRL IVOR (GLOVE) ×3 IMPLANT
GOWN STRL REUS W/ TWL LRG LVL3 (GOWN DISPOSABLE) ×2 IMPLANT
GOWN STRL REUS W/ TWL XL LVL3 (GOWN DISPOSABLE) ×1 IMPLANT
GOWN STRL REUS W/TWL LRG LVL3 (GOWN DISPOSABLE) ×4
GOWN STRL REUS W/TWL XL LVL3 (GOWN DISPOSABLE) ×2
KIT ROOM TURNOVER OR (KITS) ×3 IMPLANT
MANIFOLD NEPTUNE II (INSTRUMENTS) ×3 IMPLANT
PACK ARTHROSCOPY DSU (CUSTOM PROCEDURE TRAY) ×3 IMPLANT
PAD ARMBOARD 7.5X6 YLW CONV (MISCELLANEOUS) ×6 IMPLANT
PROBE BIPOLAR ATHRO 135MM 90D (MISCELLANEOUS) IMPLANT
SET ARTHROSCOPY TUBING (MISCELLANEOUS) ×2
SET ARTHROSCOPY TUBING LN (MISCELLANEOUS) ×1 IMPLANT
SPONGE LAP 4X18 X RAY DECT (DISPOSABLE) ×3 IMPLANT
TOWEL OR 17X24 6PK STRL BLUE (TOWEL DISPOSABLE) ×6 IMPLANT
WAND HAND CNTRL MULTIVAC 90 (MISCELLANEOUS) ×3 IMPLANT
WAND STAR VAC 90 (SURGICAL WAND) ×3 IMPLANT
WATER STERILE IRR 1000ML POUR (IV SOLUTION) ×3 IMPLANT

## 2016-08-06 NOTE — Consult Note (Addendum)
[name]ious [facility]       Reason for Consult: septic arthritis    Referring Physician: Dr. Durward Fortes  Principal Problem:   Septic arthritis of knee, right ([place])   . chlorhexidine  60 mL Topical Once  . chlorhexidine  60 mL Topical Once  . Chlorhexidine Gluconate Cloth  6 each Topical Once   And  . Chlorhexidine Gluconate Cloth  6 each Topical Once  . ketorolac  15 mg Intravenous Q6H    Recommendations: Cefazolin 2 grams IV every 8 hours for 4 weeks through March 29th ESR, CRP by home health in 2-3 weeks Ok to pull picc line at the end of the duration of treatment esr and crp in am Outpatient parenteral antibiotic therapy order set Routine HIV  PT not yet seen, still in PACU but recommendations as above.  Full consult in am by Dr. Baxter Flattery thanks  Assessment: She has septic arthritis of native knee joint with MSSA and will need prolonged IV antibiotics with monitoring during treatment.    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 Durward Fortes 2/27 and aspiration postive for 61,000 WBC, neutrophil predominance and culture positive for MSSA.  Went to the OR today for washout.  Some fever.  Pain and swelling started about 1 week ago.  No medications or medical problems otherwise.       Past Medical History:  Diagnosis Date  . Arthritis   . Cellulitis 2007  . History of kidney stones    only seen on ultrasound    Social History  Substance Use Topics  . Smoking status: Never Smoker  . Smokeless tobacco: Never Used  . Alcohol use No    Family History  Problem Relation Age of Onset  . Alzheimer's disease Mother     Allergies  Allergen Reactions  . Other     Gluten Free Diet      Vitals:   08/06/16 1428  BP: 135/89  Pulse: 90  Resp: 19  Temp: 98.2 F (36.8 C)     Lab Results  Component Value Date   WBC 8.8  08/06/2016   HGB 12.5 08/06/2016   HCT 36.7 08/06/2016   MCV 84.6 08/06/2016   PLT 259 08/06/2016    Lab Results  Component Value Date   CREATININE 0.68 08/06/2016   BUN 10 08/06/2016   NA 140 08/06/2016   K 3.6 08/06/2016   CL 105 08/06/2016   CO2 26 08/06/2016    Lab Results  Component Value Date   ALT 11 (L) 08/06/2016   AST 12 (L) 08/06/2016   ALKPHOS 49 08/06/2016     Microbiology: Recent Results (from the past 240 hour(s))  Body Fluid Culture     Status: None   Collection Time: 08/03/16 12:10 PM  Result Value Ref Range Status   Culture Few STAPHYLOCOCCUS AUREUS  Final    Comment: SOURCE: BODY FLUID&BODY FLUID   Gram Stain Abundant  Final   Gram Stain WBC present-both PMN and Mononuclear  Final   Gram Stain No Organisms Seen  Final   Organism ID, Bacteria STAPHYLOCOCCUS AUREUS  Final    Comment: Rifampin and Gentamicin should not be used as single drugs for treatment of Staph infections. Critical Results Called to,Read Back By and Verified With: Biagio Borg PA @ 2:02 PM 08/05/16 BY DWEEKS  Susceptibility   Staphylococcus aureus -  (no method available)    OXACILLIN 0.5 Sensitive     CEFAZOLIN  Sensitive     GENTAMICIN <=0.5 Sensitive     CIPROFLOXACIN <=0.5 Sensitive     LEVOFLOXACIN <=0.12 Sensitive     MOXIFLOXACIN <=0.25 Sensitive     TRIMETH/SULFA <=10 Sensitive     VANCOMYCIN <=0.5 Sensitive     CLINDAMYCIN <=0.25 Sensitive     ERYTHROMYCIN 0.5 Sensitive     LINEZOLID 2 Sensitive     QUINUPRISTIN/DALF <=0.25 Sensitive     RIFAMPIN <=0.5 Sensitive     TETRACYCLINE <=1 Sensitive     COMER, Herbie [place], MD [facility] [email or web address] [phone] pager  [phone] cell 08/06/2016, 3:41 PM

## 2016-08-06 NOTE — Anesthesia Procedure Notes (Signed)
Procedure Name: LMA Insertion Date/Time: 08/06/2016 1:27 PM Performed by: Faustino CongressWHITE, Antigone Crowell TENA Jayleana Colberg Pre-anesthesia Checklist: Patient identified, Emergency Drugs available, Suction available and Patient being monitored Patient Re-evaluated:Patient Re-evaluated prior to inductionOxygen Delivery Method: Circle System Utilized Preoxygenation: Pre-oxygenation with 100% oxygen Intubation Type: IV induction LMA: LMA inserted LMA Size: 4.0 Number of attempts: 1 Placement Confirmation: positive ETCO2 Tube secured with: Tape Dental Injury: Teeth and Oropharynx as per pre-operative assessment

## 2016-08-06 NOTE — Progress Notes (Signed)
Pharmacy Antibiotic Note  Diamond Cox is a 46 y.o. female admitted on 08/06/2016 with septic arthritis of right knee.  Pharmacy has been consulted for ancef dosing. Wt 63.5 kg, WBC WNL, creat WNL, AF. Septic arthritis of R knee s/p OR 3/2 with arthroscopic lavage of R knee.    Plan: Ancef 2 gm IV q8- give first dose in PACU - I called and spoke to PACU RN  MD must determine LOT for OPAT consult to be entered  Height: 5\' 5"  (165.1 cm) Weight: 140 lb (63.5 kg) IBW/kg (Calculated) : 57  Temp (24hrs), Avg:98.2 F (36.8 C), Min:98.2 F (36.8 C), Max:98.2 F (36.8 C)   Recent Labs Lab 08/06/16 1104  WBC 8.8  CREATININE 0.68    Estimated Creatinine Clearance: 80.8 mL/min (by C-G formula based on SCr of 0.68 mg/dL).    Allergies  Allergen Reactions  . Other     Gluten Free Diet      Thank you for allowing pharmacy to be a part of this patient's care.  Herby AbrahamMichelle T. Griffyn Kucinski, Pharm.D. [phone] 08/06/2016 4:17 PM

## 2016-08-06 NOTE — Anesthesia Preprocedure Evaluation (Addendum)
Anesthesia Evaluation  Patient identified by MRN, date of birth, ID band Patient awake    Reviewed: Allergy & Precautions, H&P , NPO status , Patient's Chart, lab work & pertinent test results  Airway Mallampati: II  TM Distance: >3 FB Neck ROM: Full    Dental no notable dental hx. (+) Teeth Intact, Dental Advisory Given   Pulmonary neg pulmonary ROS,    Pulmonary exam normal breath sounds clear to auscultation       Cardiovascular negative cardio ROS   Rhythm:Regular Rate:Normal     Neuro/Psych negative neurological ROS  negative psych ROS   GI/Hepatic negative GI ROS, Neg liver ROS,   Endo/Other  negative endocrine ROS  Renal/GU negative Renal ROS  negative genitourinary   Musculoskeletal  (+) Arthritis ,   Abdominal   Peds  Hematology negative hematology ROS (+)   Anesthesia Other Findings   Reproductive/Obstetrics negative OB ROS                            Anesthesia Physical Anesthesia Plan  ASA: I  Anesthesia Plan: General   Post-op Pain Management:    Induction: Intravenous  Airway Management Planned: LMA  Additional Equipment:   Intra-op Plan:   Post-operative Plan: Extubation in OR  Informed Consent: I have reviewed the patients History and Physical, chart, labs and discussed the procedure including the risks, benefits and alternatives for the proposed anesthesia with the patient or authorized representative who has indicated his/her understanding and acceptance.   Dental advisory given  Plan Discussed with: CRNA  Anesthesia Plan Comments:        Anesthesia Quick Evaluation

## 2016-08-06 NOTE — Progress Notes (Signed)
PATIENT ID:      Terance IceJamie Bhullar  MRN:     [id number] DOB/AGE:    44-Aug-1972 / 44 y.o.       OPERATIVE REPORT    DATE OF PROCEDURE:  08/06/2016       PREOPERATIVE DIAGNOSIS:   SEPTIC ARTHRITIS OF RIGHT KNEE                                                       Estimated body mass index is 23.3 kg/m as calculated from the following:   Height as of this encounter: 5\' 5"  (1.651 m).   Weight as of this encounter: 140 lb (63.5 kg).     POSTOPERATIVE DIAGNOSIS:   SEPTIC ARTHRITIS OF RIGHT KNEE                                                                     Estimated body mass index is 23.3 kg/m as calculated from the following:   Height as of this encounter: 5\' 5"  (1.651 m).   Weight as of this encounter: 140 lb (63.5 kg).     PROCEDURE:  Procedure(s): ARTHROSCOPIC LAVAGE OF RIGHT KNEE      SURGEON:  Norlene CampbellPeter Chenoah Mcnally, MD    ASSISTANT:   Jacqualine CodeBrian Petrarca, PA-C   (Present and scrubbed throughout the case, critical for assistance with exposure, retraction, instrumentation, and closure.)          ANESTHESIA: general     DRAINS: hemovac drain,unclamped right knee:      TOURNIQUET TIME: noner    COMPLICATIONS:  None   CONDITION:  stable  PROCEDURE IN DETAIL: [id number]   Valeria Batmaneter W Yarden Hillis 08/06/2016, 2:23 PM  Patient ID: Terance IceJamie Flegal, female   DOB: 21-Jan-1971, 46 y.o.   MRN: [id number]

## 2016-08-06 NOTE — Transfer of Care (Signed)
Immediate Anesthesia Transfer of Care Note  Patient: Diamond IceJamie Kubly  Procedure(s) Performed: Procedure(s): ARTHROSCOPIC LAVAGE OF RIGHT KNEE (Right)  Patient Location: PACU  Anesthesia Type:General  Level of Consciousness: awake, alert , oriented and patient cooperative  Airway & Oxygen Therapy: Patient Spontanous Breathing  Post-op Assessment: Report given to RN and Post -op Vital signs reviewed and stable  Post vital signs: Reviewed and stable  Last Vitals:  Vitals:   08/06/16 1428  Temp: 36.8 C    Last Pain:  Vitals:   08/06/16 1428  PainSc: 0-No pain      Patients Stated Pain Goal: 2 (08/06/16 1050)  Complications: No apparent anesthesia complications

## 2016-08-06 NOTE — Progress Notes (Signed)
The recent History & Physical has been reviewed. I have personally examined the patient today. There is no interval change to the documented History & Physical. The patient would like to proceed with the procedure.  Valeria Batmaneter W Kashon Kraynak 08/06/2016,  1:15 PM  Patient ID: Diamond Cox, female   DOB: May 17, 1971, 46 y.o.   MRN: [id number]

## 2016-08-07 DIAGNOSIS — Z91018 Allergy to other foods: Secondary | ICD-10-CM

## 2016-08-07 DIAGNOSIS — Z9689 Presence of other specified functional implants: Secondary | ICD-10-CM

## 2016-08-07 DIAGNOSIS — M00061 Staphylococcal arthritis, right knee: Principal | ICD-10-CM

## 2016-08-07 DIAGNOSIS — Z9889 Other specified postprocedural states: Secondary | ICD-10-CM

## 2016-08-07 DIAGNOSIS — B9561 Methicillin susceptible Staphylococcus aureus infection as the cause of diseases classified elsewhere: Secondary | ICD-10-CM

## 2016-08-07 DIAGNOSIS — Z820 Family history of epilepsy and other diseases of the nervous system: Secondary | ICD-10-CM

## 2016-08-07 LAB — URINE CULTURE

## 2016-08-07 LAB — C-REACTIVE PROTEIN: CRP: 10.5 mg/dL — AB (ref <1.0)

## 2016-08-07 LAB — HIV ANTIBODY (ROUTINE TESTING W REFLEX): HIV SCREEN 4TH GENERATION: NONREACTIVE

## 2016-08-07 LAB — SEDIMENTATION RATE: SED RATE: 49 mm/h — AB (ref 0–22)

## 2016-08-07 MED ORDER — CEFAZOLIN SODIUM-DEXTROSE 2-4 GM/100ML-% IV SOLN: 2.0000 g | Freq: Three times a day (TID) | INTRAVENOUS | 0 refills | Status: AC

## 2016-08-07 MED ORDER — SODIUM CHLORIDE 0.9% FLUSH: 10.0000 mL | Freq: Two times a day (BID) | INTRAVENOUS | 0 refills | Status: AC

## 2016-08-07 MED ORDER — SODIUM CHLORIDE 0.9% FLUSH: 10.0000 mL | INTRAVENOUS | Status: DC | PRN

## 2016-08-07 MED ORDER — SODIUM CHLORIDE 0.9% FLUSH: 10.0000 mL | INTRAVENOUS | 0 refills | Status: AC | PRN

## 2016-08-07 MED ORDER — CEFAZOLIN IV (FOR PTA / DISCHARGE USE ONLY): 2.0000 g | Freq: Three times a day (TID) | INTRAVENOUS | 0 refills | Status: AC

## 2016-08-07 MED ORDER — SODIUM CHLORIDE 0.9% FLUSH
10.0000 mL | INTRAVENOUS | Status: DC | PRN
  Administered 2016-08-08: 10 mL
  Filled 2016-08-07: qty 40

## 2016-08-07 MED ORDER — SODIUM CHLORIDE 0.9% FLUSH: 10.0000 mL | Freq: Three times a day (TID) | INTRAVENOUS | 0 refills | Status: AC

## 2016-08-07 MED ORDER — HEPARIN SOD (PORK) LOCK FLUSH 100 UNIT/ML IV SOLN: 250.0000 [IU] | INTRAVENOUS | Status: DC | PRN

## 2016-08-07 MED ORDER — HEPARIN SOD (PORK) LOCK FLUSH 100 UNIT/ML IV SOLN: 250.0000 [IU] | Freq: Three times a day (TID) | INTRAVENOUS | Status: AC

## 2016-08-07 MED ORDER — SODIUM CHLORIDE 0.9% FLUSH: 10.0000 mL | Freq: Two times a day (BID) | INTRAVENOUS | Status: DC

## 2016-08-07 NOTE — Consult Note (Signed)
[place] [facility]  Total days of antibiotics 2        Day 2 cefazolin               Reason for Consult:  MSSA septic arthritis   Referring Physician:  whitfield  Principal Problem:   Septic arthritis of knee, right ([place])    HPI: Diamond Cox is a 46 y.o. 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 She has past hx of celiac and hashimotos, but otherwise in great health. Roughly 8 days ago she started to notice new onset right knee pain thought to be due to exercise. She started to take ibuprofen without relief. She went to urgent care 3 days later due to still ongoing knee pain, effusion plus fever of 101F. Xray at that visit shows large effusion per my read. She was referred to Dr Rudene Anda office the following day, where she had arthrocentesis plus steroid injection roughly  5 days prior to admission. Aspirate in the office showed 61K with neutrophil predominance and cultures growing MSSA. She was brought into hospital on 3/2 for washout. She has tolerated her washout without difficulty. Already ambulated twice this morning. Denies ongoing fevers. Has drain in place. Sed rate of 43.  The patient is hoping to still be able to do a race on April 7th.   Past Medical History:  Diagnosis Date  . Arthritis   . Cellulitis 2007  . History of kidney stones    only seen on ultrasound    Allergies:  Allergies  Allergen Reactions  . Other     Gluten Free Diet     MEDICATIONS: . acetaminophen  1,000 mg Oral Q6H  .  ceFAZolin (ANCEF) IV  2 g Intravenous Q8H    Social History  Substance Use Topics  . Smoking status: Never Smoker  . Smokeless tobacco: Never Used  . Alcohol use No    Family History  Problem Relation Age of Onset  . Alzheimer's disease Mother     Review of Systems  Constitutional: Negative for fever, chills, diaphoresis, activity change, appetite change, fatigue and unexpected weight change.  HENT: Negative for  congestion, sore throat, rhinorrhea, sneezing, trouble swallowing and sinus pressure.  Eyes: Negative for photophobia and visual disturbance.  Respiratory: Negative for cough, chest tightness, shortness of breath, wheezing and stridor.  Cardiovascular: Negative for chest pain, palpitations and leg swelling.  Gastrointestinal: Negative for nausea, vomiting, abdominal pain, diarrhea, constipation, blood in stool, abdominal distention and anal bleeding.  Genitourinary: Negative for dysuria, hematuria, flank pain and difficulty urinating.  Musculoskeletal: Negative for myalgias, back pain, joint swelling, arthralgias and gait problem.  Skin: Negative for color change, pallor, rash and wound.  Neurological: Negative for dizziness, tremors, weakness and light-headedness.  Hematological: Negative for adenopathy. Does not bruise/bleed easily.  Psychiatric/Behavioral: Negative for behavioral problems, confusion, sleep disturbance, dysphoric mood, decreased concentration and agitation.     OBJECTIVE: Temp:  [97.9 F (36.6 C)-99.1 F (37.3 C)] 98.7 F (37.1 C) (03/03 0351) Pulse Rate:  [65-90] 72 (03/03 0351) Resp:  [11-20] 16 (03/03 0351) BP: (110-135)/(73-93) 118/73 (03/03 0351) SpO2:  [94 %-100 %] 98 % (03/03 0351) Weight:  [140 lb (63.5 kg)] 140 lb (63.5 kg) (03/02 1821) Physical Exam  Constitutional:  oriented to person, place, and time. appears well-developed and well-nourished. No distress.  HENT: [place]/AT, PERRLA, no scleral icterus Mouth/Throat: Oropharynx is clear and moist. No oropharyngeal exudate.  Cardiovascular: Normal rate, regular rhythm and  normal heart sounds. Exam reveals no gallop and no friction rub.  No murmur heard.  Pulmonary/Chest: Effort normal and breath sounds normal. No respiratory distress.  has no wheezes.  Neck = supple, no nuchal rigidity Abdominal: Soft. Bowel sounds are normal.  exhibits no distension. There is no tenderness.  Lymphadenopathy: no cervical  adenopathy. No axillary adenopathy Neurological: alert and oriented to person, place, and time.  Skin: Skin is warm and dry. No rash noted. No erythema. Ext: right knee wrapped from surgery with accordian drain in place  Psychiatric: a normal mood and affect.  behavior is normal.    LABS: Results for orders placed or performed during the hospital encounter of 08/06/16 (from the past 48 hour(s))  Urinalysis, Routine w reflex microscopic     Status: Abnormal   Collection Time: 08/06/16 11:03 AM  Result Value Ref Range   Color, Urine YELLOW YELLOW   APPearance CLEAR CLEAR   Specific Gravity, Urine 1.024 1.005 - 1.030   pH 5.0 5.0 - 8.0   Glucose, UA NEGATIVE NEGATIVE mg/dL   Hgb urine dipstick LARGE (A) NEGATIVE   Bilirubin Urine NEGATIVE NEGATIVE   Ketones, ur 5 (A) NEGATIVE mg/dL   Protein, ur NEGATIVE NEGATIVE mg/dL   Nitrite NEGATIVE NEGATIVE   Leukocytes, UA NEGATIVE NEGATIVE   RBC / HPF 6-30 0 - 5 RBC/hpf   WBC, UA 0-5 0 - 5 WBC/hpf   Bacteria, UA NONE SEEN NONE SEEN   Squamous Epithelial / LPF NONE SEEN NONE SEEN   Mucous PRESENT   Urine culture     Status: Abnormal   Collection Time: 08/06/16 11:03 AM  Result Value Ref Range   Specimen Description URINE, CLEAN CATCH    Special Requests NONE    Culture <10,000 COLONIES/mL INSIGNIFICANT GROWTH (A)    Report Status 08/07/2016 FINAL   CBC WITH DIFFERENTIAL     Status: None   Collection Time: 08/06/16 11:04 AM  Result Value Ref Range   WBC 8.8 4.0 - 10.5 K/uL   RBC 4.34 3.87 - 5.11 MIL/uL   Hemoglobin 12.5 12.0 - 15.0 g/dL   HCT 36.7 36.0 - 46.0 %   MCV 84.6 78.0 - 100.0 fL   MCH 28.8 26.0 - 34.0 pg   MCHC 34.1 30.0 - 36.0 g/dL   RDW 12.9 11.5 - 15.5 %   Platelets 259 150 - 400 K/uL   Neutrophils Relative % 82 %   Neutro Abs 7.2 1.7 - 7.7 K/uL   Lymphocytes Relative 14 %   Lymphs Abs 1.2 0.7 - 4.0 K/uL   Monocytes Relative 4 %   Monocytes Absolute 0.4 0.1 - 1.0 K/uL   Eosinophils Relative 0 %   Eosinophils  Absolute 0.0 0.0 - 0.7 K/uL   Basophils Relative 0 %   Basophils Absolute 0.0 0.0 - 0.1 K/uL  Comprehensive metabolic panel     Status: Abnormal   Collection Time: 08/06/16 11:04 AM  Result Value Ref Range   Sodium 140 135 - 145 mmol/L   Potassium 3.6 3.5 - 5.1 mmol/L   Chloride 105 101 - 111 mmol/L   CO2 26 22 - 32 mmol/L   Glucose, Bld 97 65 - 99 mg/dL   BUN 10 6 - 20 mg/dL   Creatinine, Ser 0.68 0.44 - 1.00 mg/dL   Calcium 9.4 8.9 - 10.3 mg/dL   Total Protein 6.8 6.5 - 8.1 g/dL   Albumin 3.6 3.5 - 5.0 g/dL   AST 12 (L) 15 - 41 U/L  ALT 11 (L) 14 - 54 U/L   Alkaline Phosphatase 49 38 - 126 U/L   Total Bilirubin 0.7 0.3 - 1.2 mg/dL   GFR calc non Af Amer >60 >60 mL/min   GFR calc Af Amer >60 >60 mL/min    Comment: (NOTE) The eGFR has been calculated using the CKD EPI equation. This calculation has not been validated in all clinical situations. eGFR's persistently <60 mL/min signify possible Chronic Kidney Disease.    Anion gap 9 5 - 15  Protime-INR     Status: None   Collection Time: 08/06/16 11:04 AM  Result Value Ref Range   Prothrombin Time 13.7 11.4 - 15.2 seconds   INR 1.04   APTT     Status: None   Collection Time: 08/06/16 11:04 AM  Result Value Ref Range   aPTT 32 24 - 36 seconds  hCG, serum, qualitative     Status: None   Collection Time: 08/06/16 11:12 AM  Result Value Ref Range   Preg, Serum NEGATIVE NEGATIVE    Comment:        THE SENSITIVITY OF THIS METHODOLOGY IS >10 mIU/mL.   Glucose, capillary     Status: None   Collection Time: 08/06/16 12:38 PM  Result Value Ref Range   Glucose-Capillary 94 65 - 99 mg/dL  Synovial cell count + diff, w/ crystals     Status: Abnormal   Collection Time: 08/06/16  1:43 PM  Result Value Ref Range   Color, Synovial YELLOW (A) YELLOW   Appearance-Synovial TURBID (A) CLEAR   Crystals, Fluid NO CRYSTALS SEEN    WBC, Synovial 60,625 (H) 0 - 200 /cu mm   Neutrophil, Synovial 84 (H) 0 - 25 %   Lymphocytes-Synovial Fld  9 0 - 20 %   Monocyte-Macrophage-Synovial Fluid 7 (L) 50 - 90 %   Eosinophils-Synovial 0 0 - 1 %   Other Cells-SYN 0   Aerobic/Anaerobic Culture (surgical/deep wound)     Status: None (Preliminary result)   Collection Time: 08/06/16  1:44 PM  Result Value Ref Range   Specimen Description KNEE    Special Requests RIGHT    Gram Stain      ABUNDANT WBC PRESENT,BOTH PMN AND MONONUCLEAR NO ORGANISMS SEEN    Culture PENDING    Report Status PENDING   C-reactive protein     Status: Abnormal   Collection Time: 08/07/16  4:19 AM  Result Value Ref Range   CRP 10.5 (H) <1.0 mg/dL  Sedimentation rate     Status: Abnormal   Collection Time: 08/07/16  4:19 AM  Result Value Ref Range   Sed Rate 49 (H) 0 - 22 mm/hr    MICRO: 2/27 aspirate few MSSA 3/2 or synovial fluid pending IMAGING: 2/26 xray shows large effusion  Assessment/Plan:  46yo F with right knee MSSA septic arthritis to native knee POD#1 arthroscopic lavage  - plan for IV abtx for 4 wk, using 3/3 as day 1 - end date is March 31st - please get picc line today - [facility] in anticipation to getting home after picc line is placed - home health orders listed below - we will arrange for follow up visit in [place] clinic  --------------------------- Diagnosis: Septic arthritis  Culture Result: MSSA  Allergies  Allergen Reactions  . Other     Gluten Free Diet      Discharge antibiotics: Per pharmacy protocol cefazolin 2gm IV Q 8hr  Duration: 28  days End Date: MArch 31st  [place] Per Protocol:  Labs weekly while on IV antibiotics: _x_ CBC with differential _x_ BMP _x_ CRP _x_ ESR  x__ Please pull PIC at completion of IV antibiotics   Fax weekly labs to [phone]  Clinic Follow Up Appt: In 2-3 wk in [facility]  @

## 2016-08-07 NOTE — Progress Notes (Signed)
CM received call from RN Nicole CellaDorothy stating PICC has been placed for pt to go home with IV ABX.  Cm called [facility] rep, Jermaine to arrange for start of Care and am told MD has not yet placed/written prescription.  [facility] requested [facility]  [facility] closes at 17:00.  No prescription as of yet to fax.

## 2016-08-07 NOTE — Progress Notes (Signed)
[facility]  [facility] Diamond Cox [facility].   I was contacted by Dr. Judyann Munsonynthia Snider to support DC home on IV Cefazolin when PICC placed and patients physician team agree pt is ready for DC.  I called and spoke at length with pt tonight to discuss POC for home on IV Cefazolin.  Shared with pt steps for IV ABX administration, IV Push, as well as plan for DC home tomorrow in hope for early DC after 6 AM dose for [facility] to support RN visit for teaching with pt, her husband and her daughter tomorrow at home with her 2 PM dose. [facility] and meds/IV Supplies are ready for delivery to her home.  If patient discharges after hours, please call [phone].   Sedalia Mutaamela S Chandler 08/07/2016, 9:08 PM

## 2016-08-07 NOTE — Progress Notes (Signed)
Peripherally Inserted Central Catheter/Midline Placement  The IV Nurse has discussed with the patient and/or persons authorized to consent for the patient, the purpose of this procedure and the potential benefits and risks involved with this procedure.  The benefits include less needle sticks, lab draws from the catheter, and the patient may be discharged home with the catheter. Risks include, but not limited to, infection, bleeding, blood clot (thrombus formation), and puncture of an artery; nerve damage and irregular heartbeat and possibility to perform a PICC exchange if needed/ordered by physician.  Alternatives to this procedure were also discussed.  Bard Power PICC patient education guide, fact sheet on infection prevention and patient information card has been provided to patient /or left at bedside.    PICC/Midline Placement Documentation  PICC Single Lumen 08/07/16 PICC Right Brachial 36 cm 0 cm (Active)  Indication for Insertion or Continuance of Line Home intravenous therapies (PICC only) 08/07/2016  4:00 PM  Exposed Catheter (cm) 0 cm 08/07/2016  4:00 PM  Site Assessment Clean;Dry;Intact 08/07/2016  4:00 PM  Line Status Flushed;Saline locked;Blood return noted 08/07/2016  4:00 PM  Dressing Type Transparent 08/07/2016  4:00 PM  Dressing Status Clean;Dry;Intact;Antimicrobial disc in place 08/07/2016  4:00 PM  Line Care Connections checked and tightened 08/07/2016  4:00 PM  Line Adjustment (NICU/IV Team Only) No 08/07/2016  4:00 PM  Dressing Intervention New dressing 08/07/2016  4:00 PM  Dressing Change Due 08/14/16 08/07/2016  4:00 PM       Elliot Dallyiggs, Hillarie Harrigan Wright 08/07/2016, 4:01 PM

## 2016-08-07 NOTE — Discharge Instructions (Signed)
OK to sponge bath  with PICC line protected. See Dr. Cleophas DunkerWhitfield in one week. IV Antibiotics for 4 wks .

## 2016-08-07 NOTE — Care Management Note (Signed)
Case Management Note  Patient Details  Name: Diamond Cox MRN: [id number] Date of Birth: 1971-02-13  Subjective/Objective: 46 yr old female admitted with a septic right knee, underwent a right knee arthroscopic lavage.                Action/Plan: Case manager spoke with patient and her family concerning discharge plans. Patient will need PICC line for IV ancef  for 4 weeks. Choice for Home Health Agency was offered. Referral was called to [facility]. Patient's daughter and husband will be her support for infusions. Crutches have been delivered to patient's room.    Expected Discharge Date:  08/08/16              Expected Discharge Plan:  Home w Home Health Services  In-House Referral:  NA  Discharge planning Services  CM Consult  Post Acute Care Choice:  Home Health Choice offered to:  Patient, Spouse  DME Arranged:  IV pump/equipment DME Agency:  NA  HH Arranged:  RN, IV Antibiotics HH Agency:  [facility]  Status of Service:  Completed, signed off  If discussed at Long Length of Stay Meetings, dates discussed:    Additional Comments:  Durenda GuthrieBrady, Espn Zeman [name], RN 08/07/2016, 12:52 PM

## 2016-08-07 NOTE — Progress Notes (Signed)
Physical Therapy Evaluation Patient Details Name: Diamond Cox MRN: [id number] DOB: December 21, 1970 Today's Date: 08/07/2016   History of Present Illness  Patient is a high level distance runner who presented with right knee pain and was found to have septic arthritis. She had an I and D on 08/06/2016. PMH: Kidney Stones, arthritis, cellulitis   Clinical Impression  Patient is independent with mobility at this time. She was advised to use crutches if she feels like it is swelling or painful with gait. She will be able to walk with nursing and her family on her own. She was advised to consult with MD regarding running when able. D/C from acute PT at this time.     Follow Up Recommendations No PT follow up    Equipment Recommendations       Recommendations for Other Services       Precautions / Restrictions Precautions Precautions: None Restrictions Weight Bearing Restrictions: No Other Position/Activity Restrictions: none in chart at this time       Mobility  Bed Mobility Overal bed mobility: Independent             General bed mobility comments: No difficulty with bed mobility   Transfers Overall transfer level: Independent Equipment used: Crutches             General transfer comment: able to transfer independently with and without crutches   Ambulation/Gait Ambulation/Gait assistance: Independent Ambulation Distance (Feet): 150 Feet Assistive device: Crutches       General Gait Details: also walked a distance without crutches. Patient advised to follow her symptoms as far as her assistve device goes.   Stairs            Wheelchair Mobility    Modified Rankin (Stroke Patients Only)       Balance Overall balance assessment: Independent                                           Pertinent Vitals/Pain Pain Assessment: 0-10 Pain Score: 3  Pain Location: Only minor pain in her knee.  Pain Descriptors / Indicators: Aching Pain  Intervention(s): Monitored during session;Limited activity within patient's tolerance;Premedicated before session;Repositioned    Home Living Family/patient expects to be discharged to:: Private residence Living Arrangements: Spouse/significant other Available Help at Discharge: Family             Additional Comments: has steps but does not have to go up them     Prior Function Level of Independence: Independent         Comments: was running 40-50 mile races     Hand Dominance   Dominant Hand: Right    Extremity/Trunk Assessment   Upper Extremity Assessment Upper Extremity Assessment: Overall WFL for tasks assessed    Lower Extremity Assessment Lower Extremity Assessment: RLE deficits/detail RLE: Unable to fully assess due to pain       Communication   Communication: No difficulties  Cognition Arousal/Alertness: Awake/alert Behavior During Therapy: WFL for tasks assessed/performed Overall Cognitive Status: Within Functional Limits for tasks assessed                      General Comments      Exercises     Assessment/Plan    PT Assessment Patent does not need any further PT services  PT Problem List  PT Treatment Interventions      PT Goals (Current goals can be found in the Care Plan section)  Acute Rehab PT Goals Patient Stated Goal: to strrt running again  PT Goal Formulation: With patient Time For Goal Achievement: 08/28/16 Potential to Achieve Goals: Fair    Frequency     Barriers to discharge        Co-evaluation               End of Session Equipment Utilized During Treatment: Gait belt Activity Tolerance: Patient tolerated treatment well Patient left: in bed Nurse Communication: Mobility status PT Visit Diagnosis: Other abnormalities of gait and mobility (R26.89)         Time: [id number] PT Time Calculation (min) (ACUTE ONLY): 23 min   Charges:   PT Evaluation $PT Eval Low Complexity: 1 Procedure      PT G Codes:         Sheran Luzavid J Carrolln PT DPT  08/07/2016, 12:49 PM

## 2016-08-07 NOTE — Op Note (Signed)
NAME:  Diamond Cox, Diamond Cox NO.:  [id number]  MEDICAL RECORD NO.:  [id number]  LOCATION:  [id number]                        FACILITY:  [facility]  PHYSICIAN:  Vonna Kotyk. Alazne Quant, M.D.DATE OF BIRTH:  Apr 24, 1971  DATE OF PROCEDURE:  08/06/2016 DATE OF DISCHARGE:                              OPERATIVE REPORT   PREOPERATIVE DIAGNOSIS:  Septic arthritis, right knee.  POSTOPERATIVE DIAGNOSIS:  Septic arthritis, right knee.  PROCEDURE:  Arthroscopic debridement and lavage, right knee.  ASSISTANT:  Biagio Borg, PAC.  ANESTHESIA:  General with local knee block.  COMPLICATIONS:  None.  HISTORY:  A 46 year old female who was seen in the office early in the week for evaluation of an effused right knee.  She denied any history of injury or trauma or recent illnesses.  She had been seen in an Urgent [place] several days prior to that and workup for gout was negative.  Her knee was aspirated with a large amount of a greenish fluid that was negative for crystals.  Late yesterday afternoon, we received a report from the lab that she did have a staph species, and she is now to have an arthroscopic lavage.  Later today, there was evidence that this was an MSSA Staph sensitive to almost all the antibiotics.  DESCRIPTION OF PROCEDURE:  Ms. Lacap was met in the holding area with the husband, identified the right knee as appropriate operative site, and marked it accordingly.  The patient was then transported to room #9 and placed under general anesthesia without difficulty.  Right lower extremity was then placed in a thigh holder.  Time-out was called. The right lower extremity was then prepped with DuraPrep in a thigh holder, the ankle sterile draping was performed.  I did mark an arthroscopic portals along the medial lateral aspect of the patella along the anterior joint lines, and infiltrated this with 0.25% Marcaine with epinephrine.  I made 2 small puncture sites with an  11 blade knife and then inserted the arthroscope laterally.  There was a cloudy fluid, this was sent for secondary cultures with sensitivities.  Third portal was established in the superior pouch medially for draining.  We then irrigated the joint with over 8000 mL of sterile saline solution.  I did not see any evidence of chondromalacia of the patella, the medial and lateral compartments, both menisci were intact as well as ACL and PCL.  There were some areas of synovitis which were debrided and some inflammatory tissue that was debrided as well with a Cuda shaver. Areas of synovitis were debrided with the low-voltage ArthroCare wand. We had nice hemostasis.  A medium-sized Hemovac was then placed through the arthroscopic portals superiorly and medially. The wounds were covered with Xeroform gauze, the Hemovac was then applied to suction.  Sterile bulky dressing was applied followed by an Ace bandage.  The patient was awoken and then returned to the postanesthesia recovery room without complications.     Vonna Kotyk. Durward Fortes, M.D.     PWW/MEDQ  D:  08/06/2016  T:  08/07/2016  Job:  [id number]

## 2016-08-07 NOTE — Progress Notes (Signed)
RN notified waiting on ID to do full assessment on pt and approve PICC placement.

## 2016-08-07 NOTE — Anesthesia Postprocedure Evaluation (Signed)
Anesthesia Post Note  Patient: Diamond Cox  Procedure(s) Performed: Procedure(s) (LRB): ARTHROSCOPIC LAVAGE OF RIGHT KNEE (Right)  Patient location during evaluation: PACU Anesthesia Type: General Level of consciousness: awake and alert Pain management: pain level controlled Vital Signs Assessment: post-procedure vital signs reviewed and stable Respiratory status: spontaneous breathing, nonlabored ventilation, respiratory function stable and patient connected to nasal cannula oxygen Cardiovascular status: blood pressure returned to baseline and stable Postop Assessment: no signs of nausea or vomiting Anesthetic complications: no       Last Vitals:  Vitals:   08/07/16 0051 08/07/16 0351  BP: 118/79 118/73  Pulse: 81 72  Resp: 16 16  Temp: 37.3 C 37.1 C    Last Pain:  Vitals:   08/07/16 0805  TempSrc:   PainSc: 3                  Alfonso Shackett,W. EDMOND

## 2016-08-07 NOTE — Progress Notes (Addendum)
   Subjective: 1 Day Post-Op Procedure(s) (LRB): ARTHROSCOPIC LAVAGE OF RIGHT KNEE (Right) Patient reports pain as mild.     MSSA.  Objective: Vital signs in last 24 hours: Temp:  [97.9 F (36.6 C)-99.1 F (37.3 C)] 98.7 F (37.1 C) (03/03 0351) Pulse Rate:  [65-90] 72 (03/03 0351) Resp:  [11-20] 16 (03/03 0351) BP: (110-135)/(73-93) 118/73 (03/03 0351) SpO2:  [94 %-100 %] 98 % (03/03 0351) Weight:  [140 lb (63.5 kg)] 140 lb (63.5 kg) (03/02 1821)  Intake/Output from previous day: 03/02 0701 - 03/03 0700 In: 2512.5 [P.O.:600; I.V.:1812.5; IV Piggyback:100] Out: 30 [Blood:30] Intake/Output this shift: Total I/O In: 710 [P.O.:600; Other:10; IV Piggyback:100] Out: -    Recent Labs  08/06/16 1104  HGB 12.5    Recent Labs  08/06/16 1104  WBC 8.8  RBC 4.34  HCT 36.7  PLT 259    Recent Labs  08/06/16 1104  NA 140  K 3.6  CL 105  CO2 26  BUN 10  CREATININE 0.68  GLUCOSE 97  CALCIUM 9.4    Recent Labs  08/06/16 1104  INR 1.04    Neurologically intact No results found.  Assessment/Plan: 1 Day Post-Op Procedure(s) (LRB): ARTHROSCOPIC LAVAGE OF RIGHT KNEE (Right) Up with therapy  Crutches.  Waiting on PIC line and HH ABX arrangements.  HV removed. Minimal drainage no purulence.  Eldred MangesMark C Shametra Cumberland 08/07/2016, 12:28 PM

## 2016-08-08 ENCOUNTER — Encounter (HOSPITAL_COMMUNITY): Payer: Self-pay | Admitting: Orthopaedic Surgery

## 2016-08-08 MED ORDER — HEPARIN SOD (PORK) LOCK FLUSH 100 UNIT/ML IV SOLN
250.0000 [IU] | INTRAVENOUS | Status: AC | PRN
  Administered 2016-08-08: 250 [IU]

## 2016-08-08 NOTE — Progress Notes (Signed)
No antibiotic prescription in file, MD made aware

## 2016-08-08 NOTE — Progress Notes (Signed)
Discharge instructions reviewed with patient and spouse, questions answered, verbalized understanding.  Scripts given for antibiotics, no prescriptions written for pain medication.  Rn did call MD to make sure no script for pain medication was needed, MD wants patient to try alternating Tylenol and Ibuprofen and call their office if additional pain medication is needed.  Patient verbalized understanding of this.   Patient transported via wheelchair to front of hospital to be taken home by husband.  RN did speak with [facility] today who confirmed everything was set for her to be seen by their nurse today.

## 2016-08-09 ENCOUNTER — Telehealth (INDEPENDENT_AMBULATORY_CARE_PROVIDER_SITE_OTHER): Payer: Self-pay | Admitting: Orthopaedic Surgery

## 2016-08-09 NOTE — Telephone Encounter (Signed)
Please adivse

## 2016-08-09 NOTE — Telephone Encounter (Signed)
Dr. Cleophas DunkerWhitfield did surgery on her on Friday.  She is wanting to know if she can take the dressing off as it has not been off since the surgery.  [id number]#[phone]#[phone].  Thank you.

## 2016-08-09 NOTE — Telephone Encounter (Signed)
called

## 2016-08-11 LAB — AEROBIC/ANAEROBIC CULTURE (SURGICAL/DEEP WOUND)

## 2016-08-11 LAB — AEROBIC/ANAEROBIC CULTURE W GRAM STAIN (SURGICAL/DEEP WOUND)

## 2016-08-16 ENCOUNTER — Encounter (INDEPENDENT_AMBULATORY_CARE_PROVIDER_SITE_OTHER): Payer: Self-pay | Admitting: Orthopaedic Surgery

## 2016-08-16 ENCOUNTER — Ambulatory Visit (INDEPENDENT_AMBULATORY_CARE_PROVIDER_SITE_OTHER): Payer: BC Managed Care – PPO | Admitting: Orthopaedic Surgery

## 2016-08-16 VITALS — BP 116/85 | HR 79 | Resp 14 | Ht 64.0 in | Wt 150.0 lb

## 2016-08-16 DIAGNOSIS — M25561 Pain in right knee: Secondary | ICD-10-CM

## 2016-08-16 NOTE — Progress Notes (Signed)
   Office Visit Note   Patient: Diamond Cox           Date of Birth: 12/21/1970           MRN: [id number] Visit Date: 08/16/2016              Requested by: No referring provider defined for this encounter. PCP: Pcp Not In System   Assessment & Plan: Visit Diagnoses: Septic arthritis right knee with MSSA  Plan: Continue IV Ancef per infectious disease, activity limitations, office after PICC line removed appproximally 1 month  Follow-Up Instructions: No Follow-up on file.   Orders:  No orders of the defined types were placed in this encounter.  No orders of the defined types were placed in this encounter.     Procedures: No procedures performed   Clinical Data: No additional findings.   Subjective: No chief complaint on file.   Ms. Diamond Cox is 10 days status post right knee surgery for septic arthritis. Pt has a pic line in right arm that gets removed 09/04/16. She is still going to the infectious disease dr next week.  No significant pain, no fever or chills.  Review of Systems   Objective: Vital Signs: LMP 07/31/2016 (Exact Date)   Physical Exam  Ortho Exam mild effusion right knee. Arthroscopic portals are healing without problem. No calf pain. No swelling distally. Neurovascular exam intact. Knee was not hot warm or red.  Specialty Comments:  No specialty comments available.  Imaging: No results found.   PMFS History: Patient Active Problem List   Diagnosis Date Noted  . Septic arthritis of knee, right (HCC) 08/06/2016   Past Medical History:  Diagnosis Date  . Arthritis   . Cellulitis 2007  . History of kidney stones    only seen on ultrasound    Family History  Problem Relation Age of Onset  . Alzheimer's disease Mother     Past Surgical History:  Procedure Laterality Date  . BUNIONECTOMY Left   . CESAREAN SECTION    . KNEE ARTHROSCOPY Right 08/06/2016   Procedure: ARTHROSCOPIC LAVAGE OF RIGHT KNEE;  Surgeon: Valeria BatmanPeter W Myan Locatelli,  MD;  Location[facility];  Service: Orthopedics;  Laterality: Right;  . WISDOM TOOTH EXTRACTION     Social History   Occupational History  . Not on file.   Social History Main Topics  . Smoking status: Never Smoker  . Smokeless tobacco: Never Used  . Alcohol use No  . Drug use: No  . Sexual activity: Not on file

## 2016-08-26 NOTE — Discharge Summary (Signed)
Joni Fears, MD   Biagio Borg, PA-C [address] [place], [place]  [address]                             [phone]  PATIENT ID: Diamond Cox        MRN:  [id number]          DOB/AGE: 05-Oct-1970 / 46 y.o.    DISCHARGE SUMMARY  ADMISSION DATE:    08/06/2016 DISCHARGE DATE:   08/08/2016   ADMISSION DIAGNOSIS: SPETIC ARTHRITIS OF RIGHT KNEE    DISCHARGE DIAGNOSIS:  SEPTIC ARTHRITIS OF RIGHT KNEE    ADDITIONAL DIAGNOSIS: Principal Problem:   Septic arthritis of knee, right ([place])  Past Medical History:  Diagnosis Date  . Arthritis   . Cellulitis 2007  . History of kidney stones    only seen on ultrasound    PROCEDURE: Procedure(s): ARTHROSCOPIC LAVAGE OF RIGHT KNEE Right on 08/06/2016  CONSULTS: Infectious Disease    HISTORY: 46 year old female presents with knee pain and swelling. She states that on  she had been running. 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 She was running on 07/28/2016 without difficulty. The following day she did squats and lunges. On Friday the third day, she started having stiffness in the knee and then on Saturday some swelling and more stiffness and discomfort. Over that Sunday she did develop some pain with increased swelling of the right knee.  Seen at [facility] urgent care facility. Workup was negative. Has had an elevated temperature high as 101. Right knee felt tight but not painful.Had been using crutches and a knee immobilizer. No specific injury or trauma or prior problem with this same knee..  An aspiration right knee and a cortisone injection in office 08/03/2016. Aspirated over 40 mL of cloudy, green fluid. Suspect she may have pseudogout and injection of cortisone given.  Cultures were sent.  Laboratory called and a verbal report was given showing staph aureus growing but speciated yet.    HOSPITAL COURSE:  Diamond Cox is a 46 y.o. admitted on 08/06/2016 and found to have  a diagnosis of SEPTIC ARTHRITIS OF RIGHT KNEE.  After appropriate laboratory studies were obtained  they were taken to the operating room on 08/06/2016 and underwent  Procedure(s): ARTHROSCOPIC LAVAGE OF RIGHT KNEE  .   They were given perioperative antibiotics:  Anti-infectives    Start     Dose/Rate Route Frequency Ordered Stop   08/07/16 0000  ceFAZolin (ANCEF) IVPB     2 g Intravenous Every 8 hours 08/07/16 1701 09/04/16 2359   08/07/16 0000  ceFAZolin (ANCEF) 2-4 GM/100ML-% IVPB     2 g 200 mL/hr over 30 Minutes Intravenous Every 8 hours 08/07/16 1701 09/04/16 2359   08/06/16 2200  ceFAZolin (ANCEF) IVPB 2g/100 mL premix  Status:  Discontinued     2 g 200 mL/hr over 30 Minutes Intravenous Every 8 hours 08/06/16 1826 08/08/16 1637   08/06/16 1830  ceFAZolin (ANCEF) IVPB 1 g/50 mL premix  Status:  Discontinued     1 g 100 mL/hr over 30 Minutes Intravenous Every 6 hours 08/06/16 1826 08/06/16 1848   08/06/16 1630  ceFAZolin (ANCEF) IVPB 2g/100 mL premix     2 g 200 mL/hr over 30 Minutes Intravenous  Once 08/06/16 1615 08/06/16 1702    .  Tolerated the procedure well.  Toradol was given post op. Infectious  disease was consulted  POD #1, allowed out of bed to a chair.  PT for ambulation and exercise program.   POD #2, continued PT and ambulation. . .  The remainder of the hospital course was dedicated to ambulation and strengthening.   The patient was discharged on 20 Days Post-Op in  Stable condition.  Blood products given:none  DIAGNOSTIC STUDIES: Recent vital signs: No data found.      Recent laboratory studies: No results for input(s): WBC, HGB, HCT, PLT in the last 168 hours. No results for input(s): NA, K, CL, CO2, BUN, CREATININE, GLUCOSE, CALCIUM in the last 168 hours. Lab Results  Component Value Date   INR 1.04 08/06/2016     Recent Radiographic Studies :  Dg Knee Complete 4 Views Right  Result Date: 08/02/2016 CLINICAL DATA:  Right knee swelling. EXAM: RIGHT  KNEE - COMPLETE 4+ VIEW COMPARISON:  None. FINDINGS: No acute fracture or dislocation. No lytic or sclerotic osseous lesion. No bone destruction or periosteal reaction. Large right knee joint effusion. Mild medial femorotibial compartment joint space narrowing. IMPRESSION: 1.  No acute osseous injury of the right knee. 2. Large right knee joint effusion. Electronically Signed   By: Kathreen Devoid   On: 08/02/2016 19:40    DISCHARGE INSTRUCTIONS: Discharge Instructions    Home infusion instructions [facility] May follow [place] Dosing Protocol; May administer Cathflo as needed to maintain patency of vascular access device.; Flushing of vascular access device: per [facility] Protocol: 0.9% NaCl pre/post medica...    Complete by:  As directed    Instructions:  May follow [place] Dosing Protocol   Instructions:  May administer Cathflo as needed to maintain patency of vascular access device.   Instructions:  Flushing of vascular access device: per [facility] Protocol: 0.9% NaCl pre/post medication administration and prn patency; Heparin 100 u/ml, 35m for implanted ports and Heparin 10u/ml, 570mfor all other central venous catheters.   Instructions:  May follow [facility] Anaphylaxis Protocol for First Dose Administration in the home: 0.9% NaCl at 25-50 ml/hr to maintain IV access for protocol meds. Epinephrine 0.3 ml IV/IM PRN and Benadryl 25-50 IV/IM PRN s/s of anaphylaxis.   Instructions:  [facility]fusion Coordinator (RN) to assist per patient IV care needs in the home PRN.      DISCHARGE MEDICATIONS:   Allergies as of 08/08/2016      Reactions   Other    Gluten Free Diet        Medication List    TAKE these medications   acetaminophen 500 MG tablet Commonly known as:  TYLENOL Take 500 mg by mouth every 6 (six) hours as needed.   ceFAZolin 2-4 GM/100ML-% IVPB Commonly known as:  ANCEF Inject 100 mLs (2 g total) into the vein every 8 (eight) hours. 2 grams IV q 8 hrs times 4 wks.     ceFAZolin IVPB Commonly known as:  ANCEF Inject 2 g into the vein every 8 (eight) hours. Indication:  MSSA septic arthritis Last Day of Therapy:  09/04/2016 Labs - Once weekly:  CBC/D and BMP, Labs - Every other week:  ESR and CRP   heparin lock flush 100 UNIT/ML Soln injection 2.5 mLs (250 Units total) by Intracatheter route 3 (three) times daily.   ibuprofen 600 MG tablet Commonly known as:  ADVIL,MOTRIN Take 600 mg by mouth every 6 (six) hours as needed.   sodium chloride flush 0.9 % Soln Commonly known as:  NS 10-40 mLs  by Intracatheter route every 8 (eight) hours.   sodium chloride flush 0.9 % Soln Commonly known as:  NS 10-40 mLs by Intracatheter route every 12 (twelve) hours.   sodium chloride flush 0.9 % Soln Commonly known as:  NS 10-40 mLs by Intracatheter route as needed (flush).            Home Infusion Instuctions        Start     Ordered   08/07/16 0000  Home infusion instructions [facility] May follow [place] Dosing Protocol; May administer Cathflo as needed to maintain patency of vascular access device.; Flushing of vascular access device: per [facility] Protocol: 0.9% NaCl pre/post medica...    Question Answer Comment  Instructions May follow [place] Dosing Protocol   Instructions May administer Cathflo as needed to maintain patency of vascular access device.   Instructions Flushing of vascular access device: per [facility] Protocol: 0.9% NaCl pre/post medication administration and prn patency; Heparin 100 u/ml, 9m for implanted ports and Heparin 10u/ml, 563mfor all other central venous catheters.   Instructions May follow [facility] Anaphylaxis Protocol for First Dose Administration in the home: 0.9% NaCl at 25-50 ml/hr to maintain IV access for protocol meds. Epinephrine 0.3 ml IV/IM PRN and Benadryl 25-50 IV/IM PRN s/s of anaphylaxis.   Instructions [facility] Infusion Coordinator (RN) to assist per patient IV care needs in the home PRN.       08/07/16 1701      FOLLOW UP VISIT:   Follow-up Information    PeGarald BaldingMD Follow up in 1 week(s).   Specialty:  Orthopedic Surgery Contact information: [id number]   Why:  Representative from [facility] contact you to arrange  date and time for Home Health RN  Contact information: [address] [place] [phone]         DISPOSITION:   Home  CONDITION:  Stable   BrMike CrazePeRileyPAFalcon Lake [phone]/22/2018 4:03 PM

## 2016-08-31 ENCOUNTER — Encounter: Payer: Self-pay | Admitting: Internal Medicine

## 2016-08-31 ENCOUNTER — Ambulatory Visit (INDEPENDENT_AMBULATORY_CARE_PROVIDER_SITE_OTHER): Payer: BC Managed Care – PPO | Admitting: Internal Medicine

## 2016-08-31 VITALS — BP 109/79 | HR 83 | Temp 98.9°F | Wt 141.0 lb

## 2016-08-31 DIAGNOSIS — M00061 Staphylococcal arthritis, right knee: Secondary | ICD-10-CM

## 2016-08-31 NOTE — Progress Notes (Signed)
HFU for septic arthritis   Patient ID: Diamond Cox, female   DOB: 09-17-1970, 46 y.o.   MRN: [id number]  HPI Diamond Cox,  Is a 46yo F who is very physically active, runs marathons and ultra-marathons. Most recently, she had acute septic arthritis of right knee with MSSA. She had picc line removed on 3/31. saw dr Durward Fortes on 3/12. Sed rate of 51 on 3/7, but on 3/12 sed rate down to 24 and crp of 10,, and labs on 3/27 - sed rate of 3 and crp of 1.6. She reports that her knee and picc line are doing well.  Has some sensitivity with picc line change  Knee still somewhat tight but much better   I have reviewed her records in [place] link Outpatient Encounter Prescriptions as of 08/31/2016  Medication Sig  . acetaminophen (TYLENOL) 500 MG tablet Take 500 mg by mouth every 6 (six) hours as needed.  Marland Kitchen ceFAZolin (ANCEF) 2-4 GM/100ML-% IVPB Inject 100 mLs (2 g total) into the vein every 8 (eight) hours. 2 grams IV q 8 hrs times 4 wks.  Marland Kitchen ceFAZolin (ANCEF) IVPB Inject 2 g into the vein every 8 (eight) hours. Indication:  MSSA septic arthritis Last Day of Therapy:  09/04/2016 Labs - Once weekly:  CBC/D and BMP, Labs - Every other week:  ESR and CRP  . heparin lock flush 100 UNIT/ML SOLN injection 2.5 mLs (250 Units total) by Intracatheter route 3 (three) times daily.  Marland Kitchen ibuprofen (ADVIL,MOTRIN) 600 MG tablet Take 600 mg by mouth every 6 (six) hours as needed.  . sodium chloride flush (NS) 0.9 % SOLN 10-40 mLs by Intracatheter route every 8 (eight) hours.  . sodium chloride flush (NS) 0.9 % SOLN 10-40 mLs by Intracatheter route every 12 (twelve) hours.  . sodium chloride flush (NS) 0.9 % SOLN 10-40 mLs by Intracatheter route as needed (flush).   No facility-administered encounter medications on file as of 08/31/2016.      Patient Active Problem List   Diagnosis Date Noted  . Septic arthritis of knee, right ([place]) 08/06/2016     Health Maintenance Due  Topic Date Due  . TETANUS/TDAP   07/08/1991  . PAP SMEAR  07/07/1993  . INFLUENZA VACCINE  01/06/2016    Soc hx: non-smoker Review of Systems Other than decrease range of motion of right knee. She is otherwise 12 point ros is negative Physical Exam   BP 109/79 (BP Location: Left Arm, Patient Position: Sitting, Cuff Size: Normal)   Pulse 83   Temp 98.9 F (37.2 C)   Wt 141 lb (64 kg)   SpO2 100%   BMI 24.20 kg/m   Physical Exam  Constitutional:  oriented to person, place, and time. appears well-developed and well-nourished. No distress.  HENT: [place]/AT, PERRLA, no scleral icterus Mouth/Throat: Oropharynx is clear and moist. No oropharyngeal exudate.  Cardiovascular: Normal rate, regular rhythm and normal heart sounds. Exam reveals no gallop and no friction rub.  No murmur heard.  Pulmonary/Chest: Effort normal and breath sounds normal. No respiratory distress.  has no wheezes.  Neck = supple, no nuchal rigidity Abdominal: Soft. Bowel sounds are normal.  exhibits no distension. There is no tenderness.  Lymphadenopathy: no cervical adenopathy. No axillary adenopathy Ext: mild warmth on right knee no effusion. Neurological: alert and oriented to person, place, and time.  Skin: Skin is warm and dry. No rash noted. No erythema. picc line dressing is c/d/i Psychiatric: a normal mood and affect.  behavior is normal.  CBC Lab Results  Component Value Date   WBC 8.8 08/06/2016   RBC 4.34 08/06/2016   HGB 12.5 08/06/2016   HCT 36.7 08/06/2016   PLT 259 08/06/2016   MCV 84.6 08/06/2016   MCH 28.8 08/06/2016   MCHC 34.1 08/06/2016   RDW 12.9 08/06/2016   LYMPHSABS 1.2 08/06/2016   MONOABS 0.4 08/06/2016   EOSABS 0.0 08/06/2016    BMET Lab Results  Component Value Date   NA 140 08/06/2016   K 3.6 08/06/2016   CL 105 08/06/2016   CO2 26 08/06/2016   GLUCOSE 97 08/06/2016   BUN 10 08/06/2016   CREATININE 0.68 08/06/2016   CALCIUM 9.4 08/06/2016   GFRNONAA >60 08/06/2016   GFRAA >60 08/06/2016    Lab  Results  Component Value Date   ESRSEDRATE 49 (H) 08/07/2016    Assessment and Plan  Septic arthritis with mssa = she has finished 4 wk of IV abtx and her inflammatory markers have normalized. No need for oral abtx.  Defer to orthopedics for her to be "ok" to run ultramarathon on April 10th.

## 2016-09-07 ENCOUNTER — Encounter (INDEPENDENT_AMBULATORY_CARE_PROVIDER_SITE_OTHER): Payer: Self-pay | Admitting: Orthopaedic Surgery

## 2016-09-07 ENCOUNTER — Ambulatory Visit (INDEPENDENT_AMBULATORY_CARE_PROVIDER_SITE_OTHER): Payer: BC Managed Care – PPO | Admitting: Orthopaedic Surgery

## 2016-09-07 VITALS — BP 142/89 | HR 82 | Ht 63.0 in | Wt 150.0 lb

## 2016-09-07 DIAGNOSIS — M25561 Pain in right knee: Secondary | ICD-10-CM

## 2016-09-07 NOTE — Progress Notes (Signed)
   Office Visit Note   Patient: Diamond Cox           Date of Birth: 04/26/1971           MRN: [id number] Visit Date: 09/07/2016              Requested by: No referring provider defined for this encounter. PCP: Pcp Not In System   Assessment & Plan: Visit Diagnoses:MSSA infection right knee status post arthroscopic lavage and IV antibiotic protocol per infectious disease.PICC line removed yesterday after completion of antibiotic therapy. No further antibiotics per infectious disease   Plan: Discussed activity level. No specific treatment necessary unless there is a recurrence of infection or pain. We'll plan to see back as necessary.  Follow-Up Instructions: No Follow-up on file.   Orders:  No orders of the defined types were placed in this encounter.  No orders of the defined types were placed in this encounter.     Procedures: No procedures performed   Clinical Data: No additional findings.   Subjective: No chief complaint on file.   Millie is 4 weeks status post for Septic arthritis  = she has finished 4 wk of IV abtx and her inflammatory markers have normalized. No need for oral abtx. Defer to orthopedics for her to be "ok" to run ultramarathon on April 10th.  Ryin is a 46yo F who is very physically active, runs marathons and ultra-marathons. Most recently, she had acute septic arthritis of right knee with MSSA. She had picc line removed on 3/31.She reports that her knee and picc line are doing well.    Review of Systems   Objective: Vital Signs: There were no vitals taken for this visit.  Physical Exam  Ortho Exam right knee exam without evidence of effusion. Knee was not hot warm red or swollen. Arthroscopic portals have healed nicely. calf pain or popliteal mass. Neurovascular exam intact distally. No limp  Specialty Comments:  No specialty comments available.  Imaging: No results found.   PMFS History: Patient Active Problem List   Diagnosis  Date Noted  . Septic arthritis of knee, right (HCC) 08/06/2016   Past Medical History:  Diagnosis Date  . Arthritis   . Cellulitis 2007  . History of kidney stones    only seen on ultrasound    Family History  Problem Relation Age of Onset  . Alzheimer's disease Mother     Past Surgical History:  Procedure Laterality Date  . BUNIONECTOMY Left   . CESAREAN SECTION    . KNEE ARTHROSCOPY Right 08/06/2016   Procedure: ARTHROSCOPIC LAVAGE OF RIGHT KNEE;  Surgeon: Valeria Batman, MD;  Location: [facility] OR;  Service: Orthopedics;  Laterality: Right;  . WISDOM TOOTH EXTRACTION     Social History   Occupational History  . Not on file.   Social History Main Topics  . Smoking status: Never Smoker  . Smokeless tobacco: Never Used  . Alcohol use No  . Drug use: No  . Sexual activity: Not on file

## 2016-09-10 ENCOUNTER — Ambulatory Visit (INDEPENDENT_AMBULATORY_CARE_PROVIDER_SITE_OTHER): Payer: BC Managed Care – PPO | Admitting: Orthopaedic Surgery

## 2016-09-16 ENCOUNTER — Encounter (INDEPENDENT_AMBULATORY_CARE_PROVIDER_SITE_OTHER): Payer: Self-pay | Admitting: Orthopaedic Surgery

## 2016-09-16 ENCOUNTER — Ambulatory Visit (INDEPENDENT_AMBULATORY_CARE_PROVIDER_SITE_OTHER): Payer: BC Managed Care – PPO | Admitting: Orthopaedic Surgery

## 2016-09-16 VITALS — BP 139/97 | HR 75 | Ht 63.0 in | Wt 150.0 lb

## 2016-09-16 DIAGNOSIS — M25561 Pain in right knee: Secondary | ICD-10-CM

## 2016-09-16 NOTE — Progress Notes (Signed)
Office Visit Note   Patient: Diamond Cox           Date of Birth: [birth year]           MRN: [id number] Visit Date: 09/16/2016              Requested by: No referring provider defined for this encounter. PCP: Pcp Not In System   Assessment & Plan: Visit Diagnoses:  1. Acute pain of right knee   5-6 weeks status post arthroscopic lavage right knee for a and SS a staph infection. Recently Diamond Cox was involved in a walking activity with onset of right knee pain. It appears that this may be a patella tendinitis rather than a recurrent infection  Plan: Follow-up in office Monday i.e. 4 days. Ice and anti-inflammatory medicines to right knee. Consider knee aspiration if no improvement with above and limited activity  Follow-Up Instructions: Return monday.   Orders:  No orders of the defined types were placed in this encounter.  No orders of the defined types were placed in this encounter.     Procedures: No procedures performed   Clinical Data: No additional findings.   Subjective: Chief Complaint  Patient presents with  . Right Knee - Edema, Follow-up    Diamond Cox is a 46 year old female with recurring right knee pain. She walked 50 miles on Saturday and Monday her cat made her slip and  Knee "locked". She walks with a limp so her knee won't fall.  5-6 weeks status post arthroscopic lavage right knee for MSSA staph infection and she has completed a course of antibiotics through the infectious disease department at [facility] with resolution of all of her symptoms. Past weekend she was involved in a prolonged walking activity and developed onset of anterior knee pain that seemed completely different than the pain she was experiencing with the infection. Her knee has not been particularly swollen. She denies any fever or chills. She has been limping. Pain is localized along the anterior aspect of her knee. The knee immobilizer was not helpful. Heat made it "worse"; ice "made it  better". No specific injury or trauma other than above. Diamond Cox and her family will be moving to [place] in early May.  HPI  Review of Systems   Objective: Vital Signs: BP (!) 139/97   Pulse 75   Ht  (1.6 m)   Wt 150 lb (68 kg)   BMI 26.57 kg/m   Physical Exam  Ortho Exam right knee exam with minimal effusion. The knee was the same temperature as the left knee. No localized areas of tenderness along the medial lateral joint or superior pouch. No patella pain. No redness. The arthroscopic portals of healed nicely. No localized areas of tenderness but patient does when she is experiencing pain is along the lateral border of the patellar tendon. Skin was intact. No erythema or ecchymosis. No calf pain. No swelling distally. No pain in the superior pouch or in either side of the patella tendon. Negative anterior drawer sign.  Specialty Comments:  No specialty comments available.  Imaging: No results found.   PMFS History: Patient Active Problem List   Diagnosis Date Noted  . Septic arthritis of knee, right (HCC) 08/06/2016   Past Medical History:  Diagnosis Date  . Arthritis   . Cellulitis 2007  . History of kidney stones    only seen on ultrasound    Family History  Problem Relation Age of Onset  . Alzheimer's disease  Mother     Past Surgical History:  Procedure Laterality Date  . BUNIONECTOMY Left   . CESAREAN SECTION    . KNEE ARTHROSCOPY Right 08/06/2016   Procedure: ARTHROSCOPIC LAVAGE OF RIGHT KNEE;  Surgeon: Valeria Batman, MD;  Location: [facility] OR;  Service: Orthopedics;  Laterality: Right;  . WISDOM TOOTH EXTRACTION     Social History   Occupational History  . Not on file.   Social History Main Topics  . Smoking status: Never Smoker  . Smokeless tobacco: Never Used  . Alcohol use No  . Drug use: No  . Sexual activity: Not on file

## 2016-09-17 ENCOUNTER — Ambulatory Visit (INDEPENDENT_AMBULATORY_CARE_PROVIDER_SITE_OTHER): Payer: BC Managed Care – PPO | Admitting: Orthopaedic Surgery

## 2016-09-21 ENCOUNTER — Ambulatory Visit (INDEPENDENT_AMBULATORY_CARE_PROVIDER_SITE_OTHER): Payer: Self-pay

## 2016-09-21 ENCOUNTER — Encounter (INDEPENDENT_AMBULATORY_CARE_PROVIDER_SITE_OTHER): Payer: Self-pay | Admitting: Orthopaedic Surgery

## 2016-09-21 ENCOUNTER — Ambulatory Visit (INDEPENDENT_AMBULATORY_CARE_PROVIDER_SITE_OTHER): Payer: BC Managed Care – PPO | Admitting: Orthopaedic Surgery

## 2016-09-21 VITALS — BP 124/60 | HR 68 | Resp 14 | Ht 64.0 in | Wt 140.0 lb

## 2016-09-21 DIAGNOSIS — M25561 Pain in right knee: Secondary | ICD-10-CM | POA: Diagnosis not present

## 2016-09-21 NOTE — Progress Notes (Signed)
   Office Visit Note   Patient: Diamond Cox           Date of Birth: 1971-03-09           MRN: [id number] Visit Date: 09/21/2016              Requested by: No referring provider defined for this encounter. PCP: Pcp Not In System   Assessment & Plan: Visit Diagnoses: acute onset right knee pain after prolonged walking  Plan: MRI right knee  Follow-Up Instructions: No Follow-up on file.   Orders:  No orders of the defined types were placed in this encounter.  No orders of the defined types were placed in this encounter.     Procedures: No procedures performed   Clinical Data: No additional findings.   Subjective: Chief Complaint  Patient presents with  . Right Knee - Follow-up   Diamond Cox had rather acute onset of right knee pain after walking 50 miles per prior note.She has not experienced fever or chills. She's not had any significant swelling of her right knee. She denied any injury or trauma. When I saw her last week her pain was primarily localized along the lateral patella tendon without any skin changes or erythema. She notes that she is a little bit better but still limping. Symptoms really have not changed. She's not had any giving way or locking. Her present pain is not related to the pain she was experiencing in her knee when she had the MSSA staph infection HPI  Review of Systems   Objective: Vital Signs: BP 124/60   Pulse 68   Resp 14   Ht  (1.626 m)   Wt 140 lb (63.5 kg)   LMP 09/01/2016   BMI 24.03 kg/m   Physical Exam  Ortho Exam right knee exam without evidence of effusion. Her knee was nice and cool. Arthroscopic portals are healed nicely. Full extension and flexion. No instability. No significant medial lateral joint pain, popping or clicking. No patellar discomfort. No popliteal pain, calf pain or distal edema. Still has minimal pain along the lateral border of the patella tendon near its insertion on the tibial tubercle. No skin  changes  Specialty Comments:  No specialty comments available.  Imaging: No results found.   PMFS History: Patient Active Problem List   Diagnosis Date Noted  . Septic arthritis of knee, right (HCC) 08/06/2016   Past Medical History:  Diagnosis Date  . Arthritis   . Cellulitis 2007  . History of kidney stones    only seen on ultrasound    Family History  Problem Relation Age of Onset  . Alzheimer's disease Mother     Past Surgical History:  Procedure Laterality Date  . BUNIONECTOMY Left   . CESAREAN SECTION    . KNEE ARTHROSCOPY Right 08/06/2016   Procedure: ARTHROSCOPIC LAVAGE OF RIGHT KNEE;  Surgeon: Valeria Batman, MD;  Location: [facility] OR;  Service: Orthopedics;  Laterality: Right;  . WISDOM TOOTH EXTRACTION     Social History   Occupational History  . Not on file.   Social History Main Topics  . Smoking status: Never Smoker  . Smokeless tobacco: Never Used  . Alcohol use No  . Drug use: No  . Sexual activity: Not on file

## 2016-10-01 ENCOUNTER — Ambulatory Visit
Admission: RE | Admit: 2016-10-01 | Discharge: 2016-10-01 | Disposition: A | Discharge status: Home / Self Care | Payer: BC Managed Care – PPO | Source: Ambulatory Visit | Attending: Orthopaedic Surgery | Admitting: Orthopaedic Surgery

## 2016-10-01 DIAGNOSIS — M25561 Pain in right knee: Secondary | ICD-10-CM

## 2016-10-04 ENCOUNTER — Ambulatory Visit (INDEPENDENT_AMBULATORY_CARE_PROVIDER_SITE_OTHER): Payer: BC Managed Care – PPO | Admitting: Orthopaedic Surgery

## 2017-11-14 IMAGING — MR MR KNEE*R* W/O CM
4 of 6 series · 19 of 40 positions shown · non-contrast
Comparison: Plain films right knee 08/02/2016.

CLINICAL DATA: Patient status post arthroscopic debridement and
lavage of the right knee for MSSA septic arthritis 08/07/2016.
Patient with recurrent knee pain inferior to the patella after
walking 50 miles the weekend of 09/11/2016.

EXAM:
MRI OF THE RIGHT KNEE WITHOUT CONTRAST
TECHNIQUE: Multiplanar, multisequence MR imaging of the knee was performed. No
intravenous contrast was administered.

[Series 3: PD fat-sat · axial · 4.0mm · 0.31mm/px · z∈[-115,-9]mm · 9 of 25 slices shown (1 of 4)]
[im 1/25]
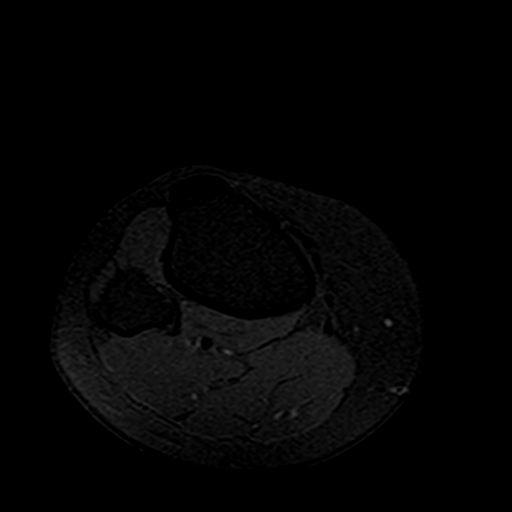
[im 4/25]
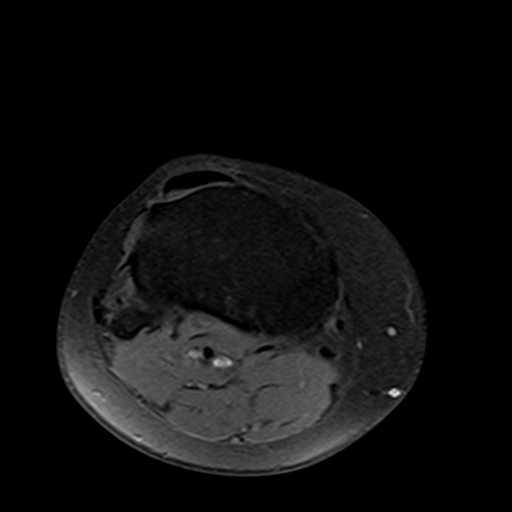
[im 7/25]
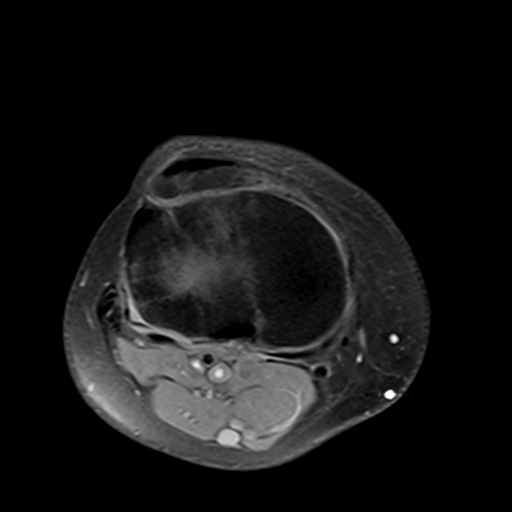
[im 10/25]
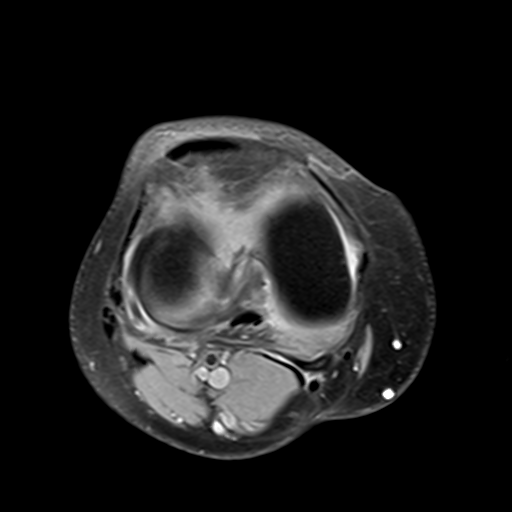
[im 13/25]
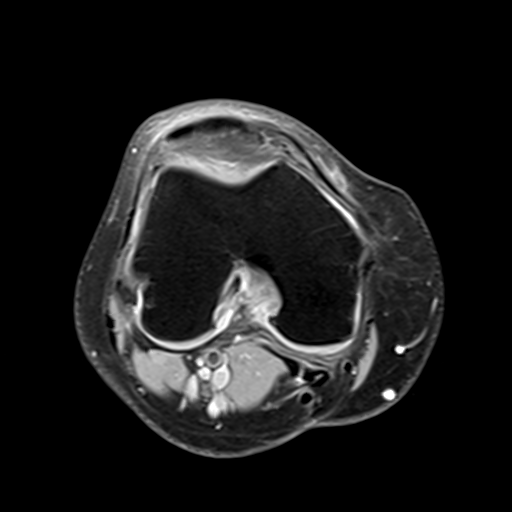
[im 16/25]
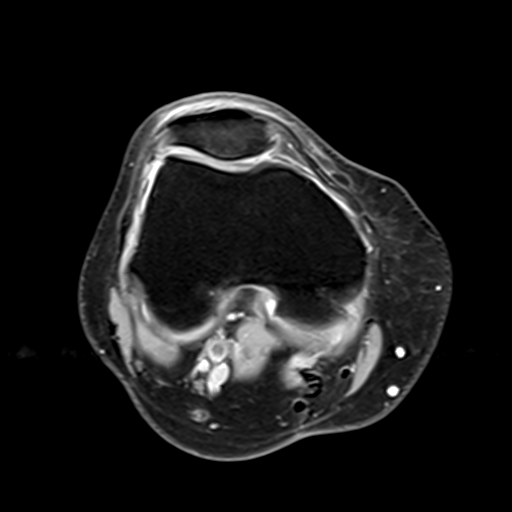
[im 19/25]
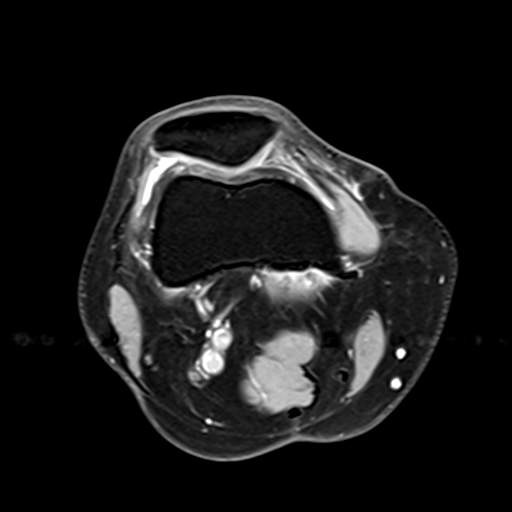
[im 22/25]
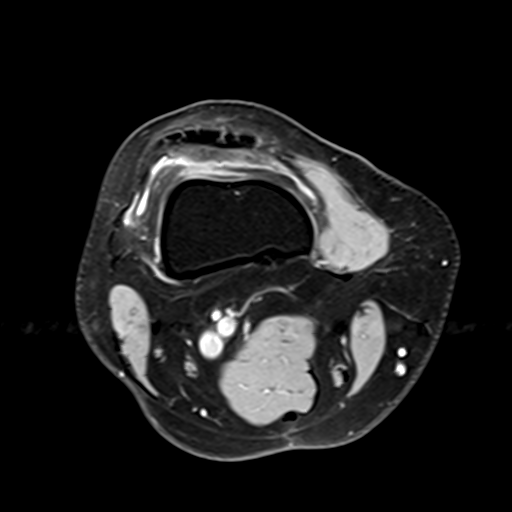
[im 25/25]
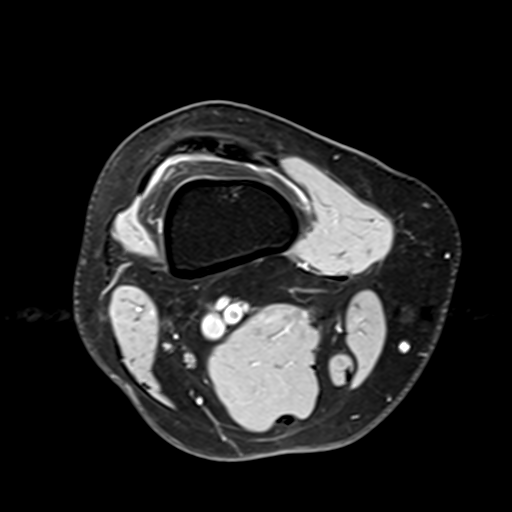

[Series 6: PD fat-sat · sagittal · 3.5mm · 0.31mm/px · 4 of 22 slices shown (2 of 4)]
[im 1/22]
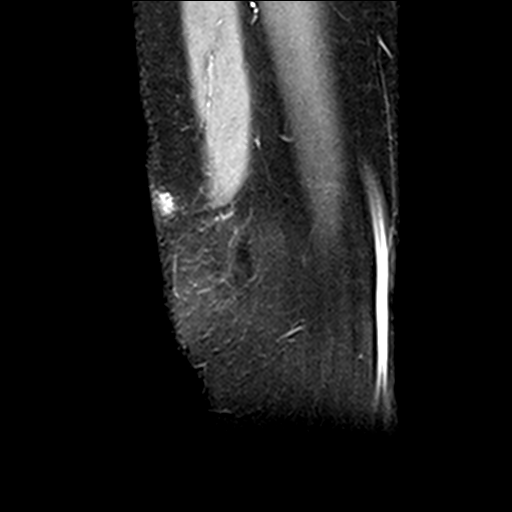
[im 4/22]
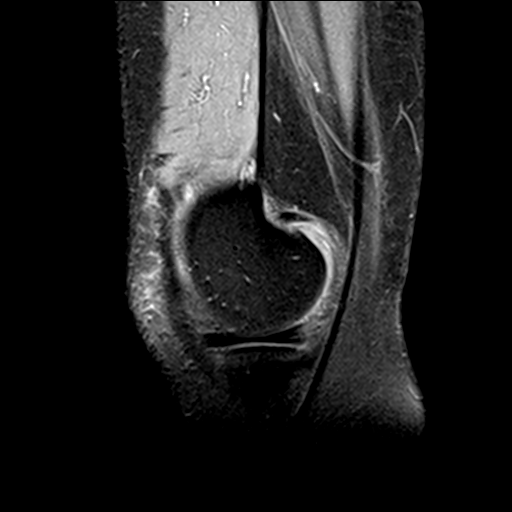
[im 11/22]
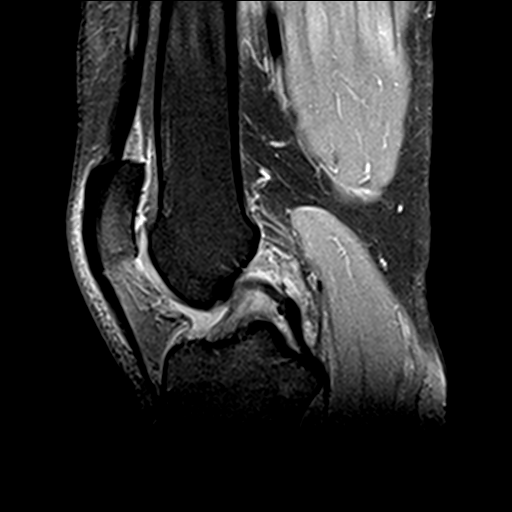
[im 18/22]
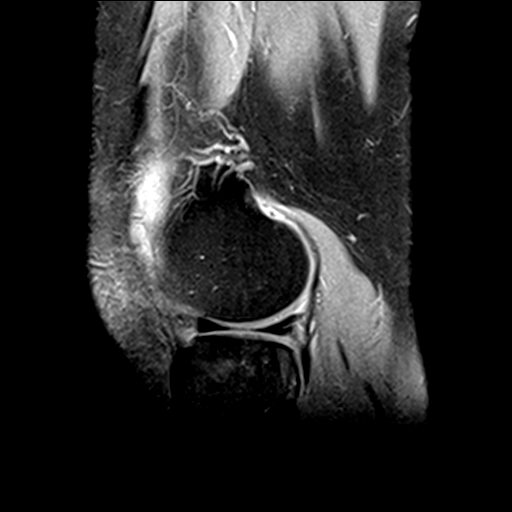

[Series 7: PD fat-sat · coronal · 3.5mm · 0.31mm/px · 3 of 24 slices shown (3 of 4)]
[im 4/24]
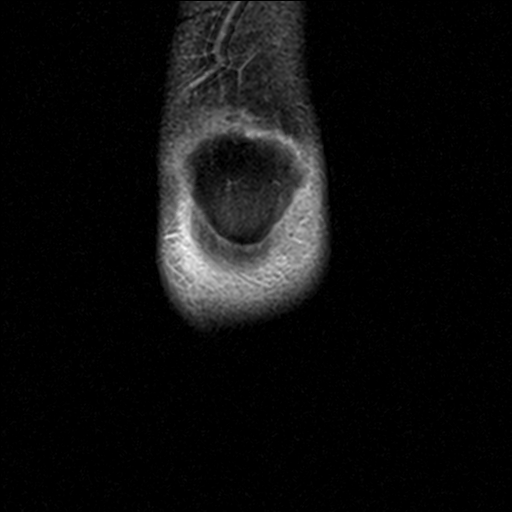
[im 12/24]
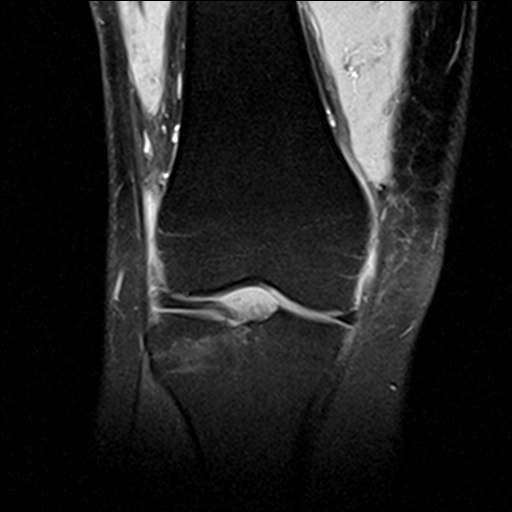
[im 20/24]
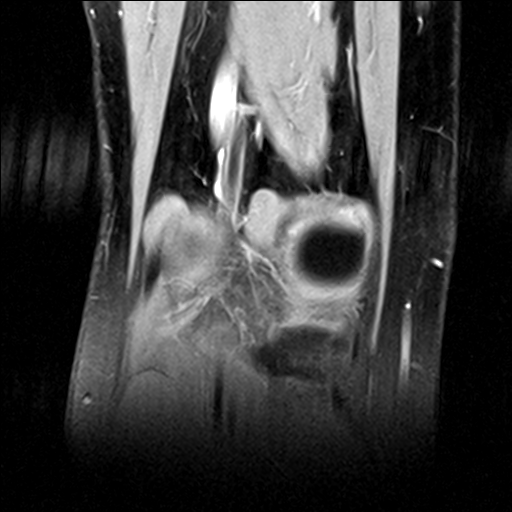

[Series 8: PD fat-sat · coronal · 2.0mm · 0.29mm/px · 3 of 11 slices shown (4 of 4)]
[im 1/11]
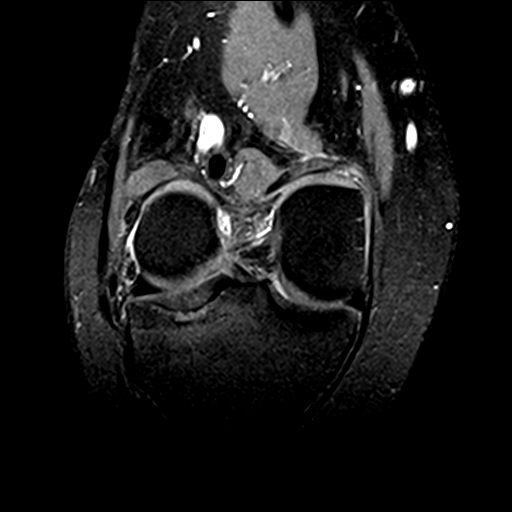
[im 6/11]
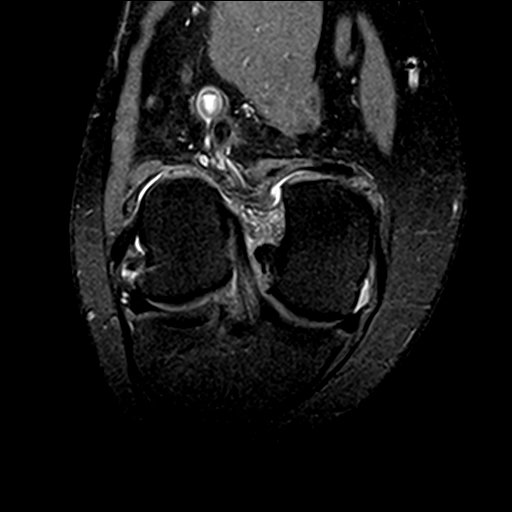
[im 11/11]
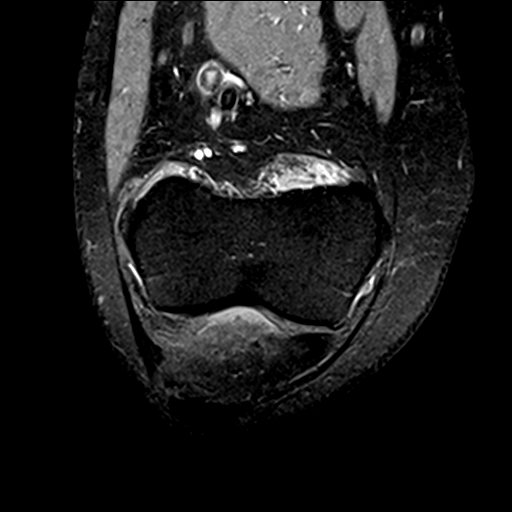

[19 of 40 positions shown; findings below may reference images not displayed]

FINDINGS: MENISCI

Medial meniscus:  Intact.

Lateral meniscus:  Intact.

LIGAMENTS

Cruciates:  Intact.

Collaterals:  Intact.

CARTILAGE

Patellofemoral: Small focus of thinning along the medial facet is
identified with mild underlying subchondral edema.

Medial:  Negative.

Lateral: There is some increased T2 signal in cartilage in the
central tibial plateau and adjacent weight bearing surface of the
lateral femoral condyle.

Joint:  No effusion.

Popliteal Fossa:  No Baker's cyst.

Extensor Mechanism:  Intact.  No evidence of patellar tendinosis.

Bones: The patient has an acute subchondral fracture of the central
aspect of the lateral tibial plateau involving an area measuring 1
cm transverse by 1.2 cm AP with associated marrow edema. The
fracture appears minimally impacted. Bone marrow signal is otherwise
normal.

Other: None.
IMPRESSION: Findings consistent with an acute, small subchondral insufficiency
fracture of the lateral tibial plateau with associated marrow edema.
Given the patient's history it is difficult to exclude infection but
the appearance is not typical of osteomyelitis or septic joint.

Negative for meniscal or ligament tear.

Mild chondromalacia lateral compartment and medial patellar facet.
